# Patient Record
Sex: Male | Born: 1955 | Race: White | Hispanic: No | Marital: Married | Smoking: Former smoker
Health system: Southern US, Community
[De-identification: ages and names within clinical notes are randomized; demographics above are authoritative.]

## PROBLEM LIST (undated history)

## (undated) DIAGNOSIS — N200 Calculus of kidney: Secondary | ICD-10-CM

## (undated) DIAGNOSIS — G4730 Sleep apnea, unspecified: Secondary | ICD-10-CM

## (undated) DIAGNOSIS — Z87442 Personal history of urinary calculi: Secondary | ICD-10-CM

## (undated) HISTORY — PX: CYST REMOVAL TRUNK: SHX6283

## (undated) HISTORY — PX: NASAL SINUS SURGERY: SHX719

## (undated) HISTORY — DX: Calculus of kidney: N20.0

## (undated) HISTORY — PX: VASECTOMY: SHX75

---

## 2000-02-12 ENCOUNTER — Encounter: Admission: RE | Admit: 2000-02-12 | Discharge: 2000-05-12 | Payer: Self-pay | Admitting: Family Medicine

## 2000-02-24 ENCOUNTER — Emergency Department (HOSPITAL_COMMUNITY): Admission: EM | Admit: 2000-02-24 | Discharge: 2000-02-24 | Payer: Self-pay | Admitting: Emergency Medicine

## 2000-03-17 ENCOUNTER — Ambulatory Visit: Admission: RE | Admit: 2000-03-17 | Discharge: 2000-03-17 | Payer: Self-pay | Admitting: Family Medicine

## 2000-12-18 ENCOUNTER — Ambulatory Visit (HOSPITAL_BASED_OUTPATIENT_CLINIC_OR_DEPARTMENT_OTHER): Admission: RE | Admit: 2000-12-18 | Discharge: 2000-12-18 | Payer: Self-pay | Admitting: Pulmonary Disease

## 2001-01-10 ENCOUNTER — Emergency Department (HOSPITAL_COMMUNITY): Admission: EM | Admit: 2001-01-10 | Discharge: 2001-01-11 | Payer: Self-pay

## 2003-01-25 ENCOUNTER — Encounter: Payer: Self-pay | Admitting: Family Medicine

## 2003-01-25 ENCOUNTER — Encounter: Admission: RE | Admit: 2003-01-25 | Discharge: 2003-01-25 | Payer: Self-pay | Admitting: Family Medicine

## 2003-09-30 ENCOUNTER — Encounter: Payer: Self-pay | Admitting: Emergency Medicine

## 2003-09-30 ENCOUNTER — Emergency Department (HOSPITAL_COMMUNITY): Admission: EM | Admit: 2003-09-30 | Discharge: 2003-09-30 | Payer: Self-pay | Admitting: Emergency Medicine

## 2004-05-11 ENCOUNTER — Ambulatory Visit (HOSPITAL_COMMUNITY): Admission: RE | Admit: 2004-05-11 | Discharge: 2004-05-11 | Payer: Self-pay | Admitting: Gastroenterology

## 2005-04-09 ENCOUNTER — Encounter (INDEPENDENT_AMBULATORY_CARE_PROVIDER_SITE_OTHER): Payer: Self-pay | Admitting: Specialist

## 2005-04-09 ENCOUNTER — Ambulatory Visit (HOSPITAL_COMMUNITY): Admission: RE | Admit: 2005-04-09 | Discharge: 2005-04-09 | Payer: Self-pay | Admitting: General Surgery

## 2008-07-08 ENCOUNTER — Ambulatory Visit (HOSPITAL_COMMUNITY): Admission: RE | Admit: 2008-07-08 | Discharge: 2008-07-08 | Payer: Self-pay | Admitting: Urology

## 2011-02-22 ENCOUNTER — Other Ambulatory Visit: Payer: Self-pay | Admitting: Family Medicine

## 2011-02-22 ENCOUNTER — Ambulatory Visit
Admission: RE | Admit: 2011-02-22 | Discharge: 2011-02-22 | Disposition: A | Discharge status: Home / Self Care | Payer: 59 | Source: Ambulatory Visit | Attending: Family Medicine | Admitting: Family Medicine

## 2011-02-22 DIAGNOSIS — R413 Other amnesia: Secondary | ICD-10-CM

## 2011-02-22 DIAGNOSIS — R51 Headache: Secondary | ICD-10-CM

## 2011-05-07 NOTE — Op Note (Signed)
NAME:  Chad Mcfarland, Chad Mcfarland                         ACCOUNT NO.:  [id number]   MEDICAL RECORD NO.:  [id number]                   PATIENT TYPE:  AMB   LOCATION:  ENDO                                 FACILITY:  [facility]   PHYSICIAN:  John C. Madilyn Fireman, M.D.                 DATE OF BIRTH:  05/14/1956   DATE OF PROCEDURE:  05/11/2004  DATE OF DISCHARGE:                                 OPERATIVE REPORT   PROCEDURE:  Colonoscopy.   INDICATIONS FOR PROCEDURE:  Family history of colon polyps in several first-  degree relatives and intermittent rectal bleeding.   DESCRIPTION OF PROCEDURE:  The patient was placed in the left lateral  decubitus position and placed on the pulse monitor with continuous low-flow  oxygen delivered by nasal cannula.  He was sedated with 75 mcg IV fentanyl  and 7 mg IV Versed.  The Olympus video colonoscope was inserted into the  rectum and advanced to the cecum, confirmed by transillumination at  McBurney's point and visualization at the ileocecal valve and appendiceal  orifice.  The prep was excellent.  The cecum, ascending, transverse,  descending, and sigmoid colon all appeared normal with no masses, polyps,  diverticula, or other mucosal abnormalities.  The rectum likewise appeared  normal, and retroflexed view of the anus revealed only small internal  hemorrhoids.  The scope was then withdrawn, and the patient returned to the  recovery room in stable condition.  He tolerated the procedure well, and  there were no immediate complications.   IMPRESSION:  1. Internal hemorrhoids.  2. Otherwise, normal colonoscopy.   PLAN:  We will repeat colonoscopy at age 55.                                               John C. Madilyn Fireman, M.D.    JCH/MEDQ  D:  05/11/2004  T:  05/11/2004  Job:  [id number]   cc:   Georgianne Fick, M.D.  [address]  [place]  [place] [address]  Fax: [phone]

## 2011-05-07 NOTE — Op Note (Signed)
NAMEDEQUAVIOUS, HARSHBERGER               ACCOUNT NO.:  [id number]   MEDICAL RECORD NO.:  [id number]          PATIENT TYPE:  AMB   LOCATION:  DAY                          FACILITY:  [facility]   PHYSICIAN:  Angelia Mould. Derrell Lolling, M.D.DATE OF BIRTH:  03-28-1956   DATE OF PROCEDURE:  04/09/2005  DATE OF DISCHARGE:                                 OPERATIVE REPORT   PREOPERATIVE DIAGNOSIS:  Multiple cysts of the back, chest wall, left  shoulder, and neck.   POSTOPERATIVE DIAGNOSIS:  Multiple cysts of the back, chest wall, left  shoulder, and neck.   PROCEDURE:  1.  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  2.  Excision of 3-mm cyst of upper back.  3.  Excision of 2.0-cm cyst of lumbar back.  4.  Excision of 1.0-cm cyst of right lateral chest wall.  5.  Excision of 1.0-cm cyst of left shoulder.   SURGEON:  Angelia Mould. Derrell Lolling, M.D.   INDICATIONS FOR PROCEDURE:  This is a 55 year old white male who has  multiple cysts and skin nodules that he wants excised.  The one in the  lumbar back, the one in the upper back, and the one in the right lateral  chest wall have all become inflamed and have drained sebaceous material.  We  sometimes have to manipulate these to get them to evacuate.  He also has a  fibrous nodule in the right posterior neck that he wants to have removed.  He has been examined in the office and counseled regarding surgery, and he  wants to have all of these removed.   DESCRIPTION OF PROCEDURE:  The patient was brought to the operating room and  placed supine.  He then had general endotracheal anesthesia induced.  He was  then rolled over and placed in a prone position with the right arm up and  the left arm down by his side.  I found that I was able to prep the neck  incision, the left shoulder incision, and all the back and chest wall  incisions into one field so that we could excise these in one position.  Marcaine 0.5% with epinephrine was used as a local infiltration  anesthetic.   On the left shoulder lesion and the lumbar back lesion, I made a sagittally-  oriented elliptical incision and excised what appeared to be chronic benign  cystic material.  On the upper back, neck, and right lateral chest wall, I  performed transverse elliptical incisions, excising the cysts and/or  nodules.  Appropriate length incisions were made to get all the way around  these according to their size.  All of these were labeled separately and  sent to pathology separately.  The wounds were irrigated with saline.  Hemostasis was excellent and achieved with electrocautery.  The lumbar  incision was relatively large, and we closed the subcutaneous tissue with  interrupted sutures of 3-0 Vicryl.  After that, all of the incisions were  closed with skin staples.  Clean bandages were placed.   The patient was taken to the recovery room in stable condition.  Estimated  blood  loss was about 20 cc or less.  There were no complications.  Sponge,  needle, and instrument counts were correct.      HMI/MEDQ  D:  04/09/2005  T:  04/09/2005  Job:  [id number]   cc:   Leanne Chang, M.D.  [address]  [place]  [place] [address]  Fax: [phone]

## 2012-07-05 ENCOUNTER — Ambulatory Visit (INDEPENDENT_AMBULATORY_CARE_PROVIDER_SITE_OTHER): Payer: 59 | Admitting: Physician Assistant

## 2012-07-05 VITALS — BP 110/62 | HR 81 | Temp 97.6°F | Resp 18 | Ht 66.75 in | Wt 218.0 lb

## 2012-07-05 DIAGNOSIS — H6520 Chronic serous otitis media, unspecified ear: Secondary | ICD-10-CM

## 2012-07-05 DIAGNOSIS — J029 Acute pharyngitis, unspecified: Secondary | ICD-10-CM

## 2012-07-05 LAB — POCT RAPID STREP A (OFFICE): Rapid Strep A Screen: NEGATIVE

## 2012-07-05 MED ORDER — AZITHROMYCIN 250 MG PO TABS: ORAL_TABLET | ORAL | Status: AC

## 2012-07-05 MED ORDER — ANTIPYRINE-BENZOCAINE 5.4-1.4 % OT SOLN: 3.0000 [drp] | Freq: Four times a day (QID) | OTIC | Status: AC | PRN

## 2012-07-05 MED ORDER — IPRATROPIUM BROMIDE 0.06 % NA SOLN: 2.0000 | Freq: Three times a day (TID) | NASAL | Status: DC

## 2012-07-05 NOTE — Progress Notes (Signed)
Patient ID: Chad Mcfarland MRN: [id number], DOB: 07-03-1956, 56 y.o. Date of Encounter: 07/05/2012, 10:06 AM  Primary Physician: Elvina Sidle, MD  Chief Complaint:  Chief Complaint  Patient presents with  . Sore Throat    x 2 days  . Otalgia    x 2 days    HPI: 56 y.o. year old male presents with a two day history of sore throat and otalgia. Afebrile. No chills. No nasal congestion or rhinorrhea. No cough. Ears feel full, leading to sensation of muffled hearing. Has tried OTC cold preps without success. No GI complaints. Appetite normal.  No sick contacts, recent antibiotics, or recent travels.   No leg trauma, sedentary periods, h/o cancer, or tobacco use.  No past medical history on file.   Home Meds: Prior to Admission medications   Medication Sig Start Date End Date Taking? Authorizing Provider  aspirin 81 MG tablet Take 81 mg by mouth daily.   Yes Historical Provider, MD  Coenzyme Q10 (CO Q 10) 100 MG CAPS Take by mouth.   Yes Historical Provider, MD  fish oil-omega-3 fatty acids 1000 MG capsule Take 2 g by mouth daily.   Yes Historical Provider, MD  Multiple Vitamin (MULTIVITAMIN) tablet Take 1 tablet by mouth daily.   Yes Historical Provider, MD  Red Yeast Rice 600 MG CAPS Take by mouth.   Yes Historical Provider, MD   Allergies:  Allergies  Allergen Reactions  . Codeine   . Tetracyclines & Related     History   Social History  . Marital Status: Married    Spouse Name: N/A    Number of Children: N/A  . Years of Education: N/A   Occupational History  . Not on file.   Social History Main Topics  . Smoking status: Former Smoker    Types: Cigarettes    Quit date: 02/25/1997  . Smokeless tobacco: Not on file  . Alcohol Use: Not on file  . Drug Use: Not on file  . Sexually Active: Not on file   Other Topics Concern  . Not on file   Social History Narrative  . No narrative on file     Review of Systems: Constitutional: negative for chills,  fever, night sweats or weight changes Cardiovascular: negative for chest pain or palpitations Respiratory: negative for hemoptysis, wheezing, or shortness of breath Abdominal: negative for abdominal pain, nausea, vomiting or diarrhea Dermatological: negative for rash Neurologic: negative for headache   Physical Exam: Blood pressure 110/62, pulse 81, temperature 97.6 F (36.4 C), temperature source Oral, resp. rate 18, height 5' 6.75" (1.695 m), weight 218 lb (98.884 kg), SpO2 95.00%., Body mass index is 34.40 kg/(m^2). General: Well developed, well nourished, in no acute distress. Head: Normocephalic, atraumatic, eyes without discharge, sclera non-icteric, nares are congested. Bilateral auditory canals clear, TM's are without perforation, pearly grey with reflective cone of light bilaterally. Serous effusion bilaterally behind bilateral TM's. Maxillary sinus TTP. Oral cavity moist, dentition normal. Posterior pharynx with post nasal drip and mild erythema. No peritonsillar abscess or tonsillar exudate. Neck: Supple. No thyromegaly. Full ROM. No lymphadenopathy. Lungs: Clear bilaterally to auscultation without wheezes, rales, or rhonchi. Breathing is unlabored.  Heart: RRR with S1 S2. No murmurs, rubs, or gallops appreciated. Abdomen: Soft, non-tender, non-distended with normoactive bowel sounds. No hepatosplenomegaly. No rebound/guarding. No obvious abdominal masses. McBurney's, Rovsing's, Iliopsoas, and table jar all negative. Msk:  Strength and tone normal for age. Extremities: No clubbing or cyanosis. No edema. Neuro: Alert and oriented  X 3. Moves all extremities spontaneously. CNII-XII grossly in tact. Psych:  Responds to questions appropriately with a normal affect.   Labs: Results for orders placed in visit on 07/05/12  POCT RAPID STREP A (OFFICE)      Component Value Range   Rapid Strep A Screen Negative  Negative     ASSESSMENT AND PLAN:  56 y.o. year old male with pharyngitis  and serous OM. -Azithromycin 250 MG #6 2 po first day then 1 po next 4 days no RF -Atrovent NS 0.06% 2 sprays each nare bid prn #1 no RF -A/B Otic #1 3gtt qid prn no RF -Mucinex -Tylenol/Motrin prn -Rest/fluids -RTC precautions -RTC 3-5 days if no improvement  Signed, Eula Listen, PA-C 07/05/2012 10:06 AM

## 2012-11-09 ENCOUNTER — Ambulatory Visit (INDEPENDENT_AMBULATORY_CARE_PROVIDER_SITE_OTHER): Payer: 59 | Admitting: Family Medicine

## 2012-11-09 ENCOUNTER — Encounter: Payer: Self-pay | Admitting: Family Medicine

## 2012-11-09 VITALS — BP 130/92 | HR 77 | Temp 98.3°F | Resp 16 | Ht 65.5 in | Wt 216.2 lb

## 2012-11-09 DIAGNOSIS — R7309 Other abnormal glucose: Secondary | ICD-10-CM

## 2012-11-09 DIAGNOSIS — N200 Calculus of kidney: Secondary | ICD-10-CM

## 2012-11-09 DIAGNOSIS — Z0000 Encounter for general adult medical examination without abnormal findings: Secondary | ICD-10-CM

## 2012-11-09 DIAGNOSIS — M549 Dorsalgia, unspecified: Secondary | ICD-10-CM

## 2012-11-09 LAB — POCT URINALYSIS DIPSTICK
Bilirubin, UA: NEGATIVE
Blood, UA: NEGATIVE
Glucose, UA: NEGATIVE
Ketones, UA: NEGATIVE
Leukocytes, UA: NEGATIVE
Nitrite, UA: NEGATIVE
Protein, UA: NEGATIVE
Spec Grav, UA: 1.02
Urobilinogen, UA: 0.2
pH, UA: 7

## 2012-11-09 LAB — CBC
HCT: 44.3 % (ref 39.0–52.0)
Hemoglobin: 15.9 g/dL (ref 13.0–17.0)
MCH: 32.6 pg (ref 26.0–34.0)
MCHC: 35.9 g/dL (ref 30.0–36.0)
MCV: 91 fL (ref 78.0–100.0)
Platelets: 143 10*3/uL — ABNORMAL LOW (ref 150–400)
RBC: 4.87 MIL/uL (ref 4.22–5.81)
RDW: 13.6 % (ref 11.5–15.5)
WBC: 5.6 10*3/uL (ref 4.0–10.5)

## 2012-11-09 LAB — COMPREHENSIVE METABOLIC PANEL
ALT: 37 U/L (ref 0–53)
AST: 30 U/L (ref 0–37)
Albumin: 4.4 g/dL (ref 3.5–5.2)
Alkaline Phosphatase: 74 U/L (ref 39–117)
BUN: 14 mg/dL (ref 6–23)
CO2: 28 mEq/L (ref 19–32)
Calcium: 9.3 mg/dL (ref 8.4–10.5)
Chloride: 101 mEq/L (ref 96–112)
Creat: 0.84 mg/dL (ref 0.50–1.35)
Glucose, Bld: 96 mg/dL (ref 70–99)
Potassium: 4.5 mEq/L (ref 3.5–5.3)
Sodium: 138 mEq/L (ref 135–145)
Total Bilirubin: 0.6 mg/dL (ref 0.3–1.2)
Total Protein: 7.2 g/dL (ref 6.0–8.3)

## 2012-11-09 LAB — LIPID PANEL
Cholesterol: 183 mg/dL (ref 0–200)
HDL: 32 mg/dL — ABNORMAL LOW (ref >39)
Total CHOL/HDL Ratio: 5.7 Ratio
Triglycerides: 512 mg/dL — ABNORMAL HIGH (ref <150)

## 2012-11-09 LAB — IFOBT (OCCULT BLOOD): IFOBT: NEGATIVE

## 2012-11-09 LAB — POCT GLYCOSYLATED HEMOGLOBIN (HGB A1C): Hemoglobin A1C: 5.4

## 2012-11-09 LAB — SEDIMENTATION RATE: Sed Rate: 1 mm/hr (ref 0–16)

## 2012-11-09 LAB — PSA: PSA: 0.81 ng/mL (ref <=4.00)

## 2012-11-09 MED ORDER — PREDNISONE 20 MG PO TABS: 40.0000 mg | ORAL_TABLET | Freq: Every day | ORAL | Status: DC

## 2012-11-09 NOTE — Patient Instructions (Addendum)
Health Maintenance, Males A healthy lifestyle and preventative care can promote health and wellness.  Maintain regular health, dental, and eye exams.  Eat a healthy diet. Foods like vegetables, fruits, whole grains, low-fat dairy products, and lean protein foods contain the nutrients you need without too many calories. Decrease your intake of foods high in solid fats, added sugars, and salt. Get information about a proper diet from your caregiver, if necessary.  Regular physical exercise is one of the most important things you can do for your health. Most adults should get at least 150 minutes of moderate-intensity exercise (any activity that increases your heart rate and causes you to sweat) each week. In addition, most adults need muscle-strengthening exercises on 2 or more days a week.   Maintain a healthy weight. The body mass index (BMI) is a screening tool to identify possible weight problems. It provides an estimate of body fat based on height and weight. Your caregiver can help determine your BMI, and can help you achieve or maintain a healthy weight. For adults 20 years and older:  A BMI below 18.5 is considered underweight.  A BMI of 18.5 to 24.9 is normal.  A BMI of 25 to 29.9 is considered overweight.  A BMI of 30 and above is considered obese.  Maintain normal blood lipids and cholesterol by exercising and minimizing your intake of saturated fat. Eat a balanced diet with plenty of fruits and vegetables. Blood tests for lipids and cholesterol should begin at age 20 and be repeated every 5 years. If your lipid or cholesterol levels are high, you are over 50, or you are a high risk for heart disease, you may need your cholesterol levels checked more frequently.Ongoing high lipid and cholesterol levels should be treated with medicines, if diet and exercise are not effective.  If you smoke, find out from your caregiver how to quit. If you do not use tobacco, do not start.  If you  choose to drink alcohol, do not exceed 2 drinks per day. One drink is considered to be 12 ounces (355 mL) of beer, 5 ounces (148 mL) of wine, or 1.5 ounces (44 mL) of liquor.  Avoid use of street drugs. Do not share needles with anyone. Ask for help if you need support or instructions about stopping the use of drugs.  High blood pressure causes heart disease and increases the risk of stroke. Blood pressure should be checked at least every 1 to 2 years. Ongoing high blood pressure should be treated with medicines if weight loss and exercise are not effective.  If you are 45 to 56 years old, ask your caregiver if you should take aspirin to prevent heart disease.  Diabetes screening involves taking a blood sample to check your fasting blood sugar level. This should be done once every 3 years, after age 45, if you are within normal weight and without risk factors for diabetes. Testing should be considered at a younger age or be carried out more frequently if you are overweight and have at least 1 risk factor for diabetes.  Colorectal cancer can be detected and often prevented. Most routine colorectal cancer screening begins at the age of 50 and continues through age 75. However, your caregiver may recommend screening at an earlier age if you have risk factors for colon cancer. On a yearly basis, your caregiver may provide home test kits to check for hidden blood in the stool. Use of a small camera at the end of a tube,   to directly examine the colon (sigmoidoscopy or colonoscopy), can detect the earliest forms of colorectal cancer. Talk to your caregiver about this at age 50, when routine screening begins. Direct examination of the colon should be repeated every 5 to 10 years through age 75, unless early forms of pre-cancerous polyps or small growths are found.  Hepatitis C blood testing is recommended for all people born from 1945 through 1965 and any individual with known risks for hepatitis C.  Healthy  men should no longer receive prostate-specific antigen (PSA) blood tests as part of routine cancer screening. Consult with your caregiver about prostate cancer screening.  Testicular cancer screening is not recommended for adolescents or adult males who have no symptoms. Screening includes self-exam, caregiver exam, and other screening tests. Consult with your caregiver about any symptoms you have or any concerns you have about testicular cancer.  Practice safe sex. Use condoms and avoid high-risk sexual practices to reduce the spread of sexually transmitted infections (STIs).  Use sunscreen with a sun protection factor (SPF) of 30 or greater. Apply sunscreen liberally and repeatedly throughout the day. You should seek shade when your shadow is shorter than you. Protect yourself by wearing long sleeves, pants, a wide-brimmed hat, and sunglasses year round, whenever you are outdoors.  Notify your caregiver of new moles or changes in moles, especially if there is a change in shape or color. Also notify your caregiver if a mole is larger than the size of a pencil eraser.  A one-time screening for abdominal aortic aneurysm (AAA) and surgical repair of large AAAs by sound wave imaging (ultrasonography) is recommended for ages 65 to 75 years who are current or former smokers.  Stay current with your immunizations. Document Released: 06/03/2008 Document Revised: 02/28/2012 Document Reviewed: 05/03/2011 [employer] Patient Information 2013 [employer].  

## 2012-11-09 NOTE — Progress Notes (Signed)
@[email or web address] @  Patient ID: Chad Mcfarland MRN: [id number], DOB: Oct 06, 1956 56 y.o. Date of Encounter: 11/09/2012, 8:11 AM  Primary Physician: Elvina Sidle, MD  Chief Complaint: Physical (CPE)  HPI: 56 y.o. y/o male with history noted below here for CPE.  Chad Mcfarland fell last May when stepping off slippery lawn mower.  He hit his head and may have had brief LOC.  He went to [facility] for evaluation including a head CT which was normal.  He suffered dysequilibrium for two to three months afterwards, precipitated by sudden moving.  He decided to check his blood sugars when the dizziness occurred, and the levels have ranged from 90-120, not high enough to account for the dizziness.  At this point, the symptoms seem to have cleared  Chad Mcfarland suffers from chronic LBP x 15 years.  We have used prednisone with good effect in the past, but the relief is only temporary.  He is postponing surgery as long as possible.  He also has some stiffness in the right hip.  He has a h/o kidney stones, but, despite the presence of a small stone on imaging, he has no symptoms at present.  He needs a refill on his epi pen and would like to get the shingles vaccine.  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  Patient had colonoscopy 5 years ago and had no significant findings.  He had a pneumovax already.  Review of Systems: Consitutional: No fever, chills, fatigue, night sweats, lymphadenopathy, or weight changes. Eyes: No visual changes, eye redness, or discharge. ENT/Mouth: Ears: No otalgia, tinnitus, hearing loss, discharge. Nose: No congestion, rhinorrhea, sinus pain, or epistaxis. Throat: No sore throat, post nasal drip, or teeth pain. Cardiovascular: No CP, palpitations, diaphoresis, DOE, edema, orthopnea, PND. Respiratory: No cough, hemoptysis, SOB, or wheezing. Gastrointestinal: No anorexia, dysphagia, reflux, pain, nausea, vomiting, hematemesis, diarrhea, constipation, BRBPR, or melena. Genitourinary: No dysuria,  frequency, urgency, hematuria, incontinence, nocturia, decreased urinary stream, discharge, impotence, or testicular pain/masses. Musculoskeletal: No decreased ROM, myalgias, stiffness, joint swelling, or weakness.  Chronic low back pain for 15 years, relieved by prednisone temporarily Skin: No rash, erythema, lesion changes, pain, warmth, jaundice, or pruritis. Neurological: No headache, dizziness, syncope, seizures, tremors, memory loss, coordination problems, or paresthesias. Psychological: No anxiety, depression, hallucinations, SI/HI. Endocrine: No fatigue, polydipsia, polyphagia, polyuria, or known diabetes. All other systems were reviewed and are otherwise negative.  Past Medical History  Diagnosis Date  . Kidney stone      Past Surgical History  Procedure Date  . Vasectomy   . Nasal sinus surgery     Home Meds:  Prior to Admission medications   Medication Sig Start Date End Date Taking? Authorizing Provider  aspirin 81 MG tablet Take 81 mg by mouth daily.   Yes Historical Provider, MD  Coenzyme Q10 (CO Q 10) 100 MG CAPS Take by mouth.   Yes Historical Provider, MD  fish oil-omega-3 fatty acids 1000 MG capsule Take 2 g by mouth daily.   Yes Historical Provider, MD  Multiple Vitamin (MULTIVITAMIN) tablet Take 1 tablet by mouth daily.   Yes Historical Provider, MD  niacin 500 MG tablet Take 500 mg by mouth daily with breakfast.   Yes Historical Provider, MD  OVER THE COUNTER MEDICATION Glucosamine 150 mg one daily   Yes Historical Provider, MD  Red Yeast Rice 600 MG CAPS Take by mouth.   Yes Historical Provider, MD  ipratropium (ATROVENT) 0.06 % nasal spray Place 2 sprays into the nose 3 (three) times daily.  07/05/12 07/05/13  Sondra Barges, PA-C    Allergies:  Allergies  Allergen Reactions  . Codeine   . Tetracyclines & Related     History   Social History  . Marital Status: Married    Spouse Name: N/A    Number of Children: N/A  . Years of Education: N/A    Occupational History  . Not on file.   Social History Main Topics  . Smoking status: Former Smoker    Types: Cigarettes    Quit date: 02/25/1997  . Smokeless tobacco: Not on file  . Alcohol Use: Yes  . Drug Use: No  . Sexually Active: Not on file   Other Topics Concern  . Not on file   Social History Narrative  . No narrative on file    Family History  Problem Relation Age of Onset  . Heart disease Mother   . Diabetes Mother   . Diabetes Sister     Physical Exam: Blood pressure 130/92, pulse 77, temperature 98.3 F (36.8 C), temperature source Oral, resp. rate 16, height 5' 5.5" (1.664 m), weight 216 lb 3.2 oz (98.068 kg), SpO2 96.00%.  General: Well developed, well nourished, in no acute distress. HEENT: Normocephalic, atraumatic. Conjunctiva pink, sclera non-icteric. Pupils 2 mm constricting to 1 mm, round, regular, and equally reactive to light and accomodation. EOMI. Internal auditory canal clear. TMs with good cone of light and without pathology. Nasal mucosa pink. Nares are without discharge. No sinus tenderness. Oral mucosa pink. Dentition normal. Pharynx without exudate. Small ranula left buccal mucosa  Neck: Supple. Trachea midline. No thyromegaly. Full ROM. No lymphadenopathy. Lungs: Clear to auscultation bilaterally without wheezes, rales, or rhonchi. Breathing is of normal effort and unlabored. Cardiovascular: RRR with S1 S2. No murmurs, rubs, or gallops appreciated. Distal pulses 2+ symmetrically. No carotid or abdominal bruits Abdomen: Soft, non-tender, non-distended with normoactive bowel sounds. No hepatosplenomegaly or masses. No rebound/guarding. No CVA tenderness. Umbilical hernia present Rectal: No external hemorrhoids or fissures. Rectal vault without masses.  Genitourinary:  circumcised male. No penile lesions. Testes descended bilaterally, and smooth without tenderness or masses.  Musculoskeletal: Full range of motion and 5/5 strength throughout.  Without swelling, atrophy, tenderness, crepitus, or warmth. Extremities without clubbing, cyanosis, or edema. Calves supple. Skin: Warm and moist without erythema, ecchymosis, wounds, or rash. Sebaceous cyst 1/2 cm left scapular area.  Scattered freckles on backs. Surgical scar left anterior shoulder from mole removal as teenager Neuro: A+Ox3. CN II-XII grossly intact. Moves all extremities spontaneously. Full sensation throughout. Normal gait. DTR 2+ throughout upper and lower extremities. Finger to nose intact. Psych:  Responds to questions appropriately with a normal affect.   Studies: CBC, CMET, Lipid, PSA, TSH, sed rate, A1C UA:   Assessment/Plan:  56 y.o. y/o  male here for CPE Aside from the back pain, he is doing fairly well.  I would like him to lose 50 lbs as this would help with the back pain and hip soreness. 1. Annual physical exam  TSH, PSA, Lipid panel, Comprehensive metabolic panel, CBC, Sedimentation Rate, IFOBT POC (occult bld, rslt in office)  2. Back pain  PSA, CBC, Sedimentation Rate, predniSONE (DELTASONE) 20 MG tablet, POCT urinalysis dipstick  3. Kidney stone  POCT urinalysis dipstick  4. Elevated glucose  POCT glycosylated hemoglobin (Hb A1C)   I wrote for refills on the epipen, and the Zostavax (shingles vaccine) separately -  Signed, Elvina Sidle, MD 11/09/2012 8:11 AM

## 2012-11-10 LAB — TSH: TSH: 3.029 u[IU]/mL (ref 0.350–4.500)

## 2013-09-03 ENCOUNTER — Other Ambulatory Visit: Payer: Self-pay | Admitting: Dermatology

## 2013-10-10 ENCOUNTER — Encounter: Payer: Self-pay | Admitting: *Deleted

## 2013-11-03 ENCOUNTER — Encounter: Payer: Self-pay | Admitting: Family Medicine

## 2013-11-03 ENCOUNTER — Ambulatory Visit: Payer: Self-pay | Admitting: Family Medicine

## 2013-11-03 ENCOUNTER — Ambulatory Visit (INDEPENDENT_AMBULATORY_CARE_PROVIDER_SITE_OTHER): Payer: 59 | Admitting: Family Medicine

## 2013-11-03 VITALS — BP 126/80 | HR 88 | Temp 98.0°F | Resp 16 | Ht 65.5 in | Wt 222.0 lb

## 2013-11-03 DIAGNOSIS — Z0000 Encounter for general adult medical examination without abnormal findings: Secondary | ICD-10-CM

## 2013-11-03 DIAGNOSIS — Z0289 Encounter for other administrative examinations: Secondary | ICD-10-CM

## 2013-11-03 LAB — COMPREHENSIVE METABOLIC PANEL
ALT: 41 U/L (ref 0–53)
AST: 30 U/L (ref 0–37)
Albumin: 4.4 g/dL (ref 3.5–5.2)
Alkaline Phosphatase: 72 U/L (ref 39–117)
BUN: 16 mg/dL (ref 6–23)
CO2: 25 mEq/L (ref 19–32)
Calcium: 8.9 mg/dL (ref 8.4–10.5)
Chloride: 108 mEq/L (ref 96–112)
Creat: 0.87 mg/dL (ref 0.50–1.35)
Glucose, Bld: 111 mg/dL — ABNORMAL HIGH (ref 70–99)
Potassium: 4.2 mEq/L (ref 3.5–5.3)
Sodium: 135 mEq/L (ref 135–145)
Total Bilirubin: 0.6 mg/dL (ref 0.3–1.2)
Total Protein: 7.1 g/dL (ref 6.0–8.3)

## 2013-11-03 LAB — POCT CBC
Granulocyte percent: 54.1 %G (ref 37–80)
HCT, POC: 48.3 % (ref 43.5–53.7)
Hemoglobin: 15.6 g/dL (ref 14.1–18.1)
Lymph, poc: 2.1 (ref 0.6–3.4)
MCH, POC: 32.2 pg — AB (ref 27–31.2)
MCHC: 32.3 g/dL (ref 31.8–35.4)
MCV: 99.6 fL — AB (ref 80–97)
MID (cbc): 0.4 (ref 0–0.9)
MPV: 8 fL (ref 0–99.8)
POC Granulocyte: 2.9 (ref 2–6.9)
POC LYMPH PERCENT: 38.9 %L (ref 10–50)
POC MID %: 7 %M (ref 0–12)
Platelet Count, POC: 136 10*3/uL — AB (ref 142–424)
RBC: 4.85 M/uL (ref 4.69–6.13)
RDW, POC: 14.4 %
WBC: 5.4 10*3/uL (ref 4.6–10.2)

## 2013-11-03 LAB — POCT URINALYSIS DIPSTICK
Bilirubin, UA: NEGATIVE
Blood, UA: NEGATIVE
Glucose, UA: NEGATIVE
Ketones, UA: NEGATIVE
Leukocytes, UA: NEGATIVE
Nitrite, UA: NEGATIVE
Protein, UA: NEGATIVE
Spec Grav, UA: 1.02
Urobilinogen, UA: 0.2
pH, UA: 5

## 2013-11-03 LAB — LIPID PANEL
Cholesterol: 206 mg/dL — ABNORMAL HIGH (ref 0–200)
HDL: 38 mg/dL — ABNORMAL LOW (ref >39)
LDL Cholesterol: 101 mg/dL — ABNORMAL HIGH (ref 0–99)
Total CHOL/HDL Ratio: 5.4 Ratio
Triglycerides: 336 mg/dL — ABNORMAL HIGH (ref <150)
VLDL: 67 mg/dL — ABNORMAL HIGH (ref 0–40)

## 2013-11-03 LAB — IFOBT (OCCULT BLOOD): IFOBT: NEGATIVE

## 2013-11-03 NOTE — Progress Notes (Signed)
Patient ID: Chad Mcfarland MRN: [id number], DOB: 09-01-1956 57 y.o. Date of Encounter: 11/03/2013, 8:59 AM  Primary Physician: Elvina Sidle, MD  Chief Complaint: Physical (CPE)  HPI: 57 y.o. y/o male with history noted below here for CPE.  Doing well. No issues/complaints. Married.  Not driving but wants to keep [employer] Works for [employer].  Review of Systems: Consitutional: No fever, chills, fatigue, night sweats, lymphadenopathy, or weight changes. Eyes: No visual changes, eye redness, or discharge. ENT/Mouth: Ears: No otalgia, tinnitus, hearing loss, discharge. Nose: No congestion, rhinorrhea, sinus pain, or epistaxis. Throat: No sore throat, post nasal drip, or teeth pain. Cardiovascular: No CP, palpitations, diaphoresis, DOE, edema, orthopnea, PND. Respiratory: No cough, hemoptysis, SOB, or wheezing. Gastrointestinal: No anorexia, dysphagia, reflux, pain, nausea, vomiting, hematemesis, diarrhea, constipation, BRBPR, or melena. Genitourinary: No dysuria, frequency, urgency, hematuria, incontinence, nocturia, decreased urinary stream, discharge, impotence, or testicular pain/masses. Musculoskeletal: No decreased ROM, myalgias, stiffness, joint swelling, or weakness. Skin: No rash, erythema, lesion changes, pain, warmth, jaundice, or pruritis. Neurological: No headache, dizziness, syncope, seizures, tremors, memory loss, coordination problems, or paresthesias. Psychological: No anxiety, depression, hallucinations, SI/HI. Endocrine: No fatigue, polydipsia, polyphagia, polyuria, or known diabetes. All other systems were reviewed and are otherwise negative.  Past Medical History  Diagnosis Date  . Kidney stone      Past Surgical History  Procedure Laterality Date  . Vasectomy    . Nasal sinus surgery      Home Meds:  Prior to Admission medications   Medication Sig Start Date End Date Taking? Authorizing Provider  aspirin 81 MG tablet Take 81 mg by mouth daily.   Yes  Historical Provider, MD  Coenzyme Q10 (CO Q 10) 100 MG CAPS Take by mouth.   Yes Historical Provider, MD  fish oil-omega-3 fatty acids 1000 MG capsule Take 2 g by mouth daily.   Yes Historical Provider, MD  Multiple Vitamin (MULTIVITAMIN) tablet Take 1 tablet by mouth daily.   Yes Historical Provider, MD  niacin 500 MG tablet Take 500 mg by mouth daily with breakfast.   Yes Historical Provider, MD  Red Yeast Rice 600 MG CAPS Take by mouth.   Yes Historical Provider, MD  ipratropium (ATROVENT) 0.06 % nasal spray Place 2 sprays into the nose 3 (three) times daily. 07/05/12 07/05/13  Ryan M Dunn, PA-C  OVER THE COUNTER MEDICATION Glucosamine 150 mg one daily    Historical Provider, MD  predniSONE (DELTASONE) 20 MG tablet Take 2 tablets (40 mg total) by mouth daily. 11/09/12   Elvina Sidle, MD    Allergies:  Allergies  Allergen Reactions  . Codeine   . Tetracyclines & Related     History   Social History  . Marital Status: Married    Spouse Name: N/A    Number of Children: N/A  . Years of Education: N/A   Occupational History  . Not on file.   Social History Main Topics  . Smoking status: Former Smoker    Types: Cigarettes    Quit date: 02/25/1997  . Smokeless tobacco: Not on file  . Alcohol Use: Yes  . Drug Use: No  . Sexual Activity: Not on file   Other Topics Concern  . Not on file   Social History Narrative  . No narrative on file    Family History  Problem Relation Age of Onset  . Heart disease Mother   . Diabetes Mother   . Diabetes Sister     Physical Exam: Blood pressure 126/80,  pulse 88, temperature 98 F (36.7 C), temperature source Oral, resp. rate 16, height 5' 5.5" (1.664 m), weight 222 lb (100.699 kg), SpO2 96.00%.  General: Well developed, well nourished, in no acute distress. HEENT: Normocephalic, atraumatic. Conjunctiva pink, sclera non-icteric. Pupils 2 mm constricting to 1 mm, round, regular, and equally reactive to light and accomodation. EOMI.  Internal auditory canal clear. TMs with good cone of light and without pathology. Nasal mucosa pink. Nares are without discharge. No sinus tenderness. Oral mucosa pink. Dentition normal. Pharynx without exudate.   Neck: Supple. Trachea midline. No thyromegaly. Full ROM. No lymphadenopathy. Lungs: Clear to auscultation bilaterally without wheezes, rales, or rhonchi. Breathing is of normal effort and unlabored. Cardiovascular: RRR with S1 S2. No murmurs, rubs, or gallops appreciated. Distal pulses 2+ symmetrically. No carotid or abdominal bruits Abdomen: Soft, non-tender, non-distended with normoactive bowel sounds. No hepatosplenomegaly or masses. No rebound/guarding. No CVA tenderness. Without hernias.  Rectal: No external hemorrhoids or fissures. Rectal vault without masses.  Genitourinary:  circumcised male. No penile lesions. Testes descended bilaterally, and smooth without tenderness or masses.  Musculoskeletal: Full range of motion and 5/5 strength throughout. Without swelling, atrophy, tenderness, crepitus, or warmth. Extremities without clubbing, cyanosis, or edema. Calves supple. Skin: Warm and moist without erythema, ecchymosis, wounds, or rash. Neuro: A+Ox3. CN II-XII grossly intact. Moves all extremities spontaneously. Full sensation throughout. Normal gait. DTR 2+ throughout upper and lower extremities. Finger to nose intact. Psych:  Responds to questions appropriately with a normal affect.   Assessment/Plan:  57 y.o. y/o  male here for CPE -Annual physical exam - Plan: POCT CBC, IFOBT POC (occult bld, rslt in office), POCT urinalysis dipstick, Comprehensive metabolic panel, Lipid panel, PSA    Signed, Elvina Sidle, MD 11/03/2013 8:59 AM

## 2013-11-04 LAB — PSA: PSA: 1.96 ng/mL (ref <=4.00)

## 2013-11-04 NOTE — Progress Notes (Signed)
Second note for this patient.  Please refer to other entry

## 2014-03-18 ENCOUNTER — Other Ambulatory Visit: Payer: Self-pay | Admitting: Family Medicine

## 2014-03-18 NOTE — Telephone Encounter (Signed)
Dr L, I see that you have written for RFs before for prednisone to have on hand for chronic LBP. Do you want to RF that and for Epi Pen?

## 2014-06-02 ENCOUNTER — Ambulatory Visit (INDEPENDENT_AMBULATORY_CARE_PROVIDER_SITE_OTHER): Payer: 59 | Admitting: Emergency Medicine

## 2014-06-02 VITALS — BP 128/72 | HR 70 | Temp 98.0°F | Resp 14 | Ht 65.0 in | Wt 199.6 lb

## 2014-06-02 DIAGNOSIS — M5430 Sciatica, unspecified side: Secondary | ICD-10-CM

## 2014-06-02 MED ORDER — LIDOCAINE 5 % EX PTCH: 1.0000 | MEDICATED_PATCH | CUTANEOUS | Status: DC

## 2014-06-02 MED ORDER — PREDNISONE 10 MG PO KIT: PACK | ORAL | Status: DC

## 2014-06-02 NOTE — Addendum Note (Signed)
Addended by: Carmelina DaneANDERSON, Taneasha Fuqua S on: 06/02/2014 03:03 PM   Modules accepted: Orders

## 2014-06-02 NOTE — Patient Instructions (Signed)
Sciatica °Sciatica is pain, weakness, numbness, or tingling along the path of the sciatic nerve. The nerve starts in the lower back and runs down the back of each leg. The nerve controls the muscles in the lower leg and in the back of the knee, while also providing sensation to the back of the thigh, lower leg, and the sole of your foot. Sciatica is a symptom of another medical condition. For instance, nerve damage or certain conditions, such as a herniated disk or bone spur on the spine, pinch or put pressure on the sciatic nerve. This causes the pain, weakness, or other sensations normally associated with sciatica. Generally, sciatica only affects one side of the body. °CAUSES  °· Herniated or slipped disc. °· Degenerative disk disease. °· A pain disorder involving the narrow muscle in the buttocks (piriformis syndrome). °· Pelvic injury or fracture. °· Pregnancy. °· Tumor (rare). °SYMPTOMS  °Symptoms can vary from mild to very severe. The symptoms usually travel from the low back to the buttocks and down the back of the leg. Symptoms can include: °· Mild tingling or dull aches in the lower back, leg, or hip. °· Numbness in the back of the calf or sole of the foot. °· Burning sensations in the lower back, leg, or hip. °· Sharp pains in the lower back, leg, or hip. °· Leg weakness. °· Severe back pain inhibiting movement. °These symptoms may get worse with coughing, sneezing, laughing, or prolonged sitting or standing. Also, being overweight may worsen symptoms. °DIAGNOSIS  °Your caregiver will perform a physical exam to look for common symptoms of sciatica. He or she may ask you to do certain movements or activities that would trigger sciatic nerve pain. Other tests may be performed to find the cause of the sciatica. These may include: °· Blood tests. °· X-rays. °· Imaging tests, such as an MRI or CT scan. °TREATMENT  °Treatment is directed at the cause of the sciatic pain. Sometimes, treatment is not necessary  and the pain and discomfort goes away on its own. If treatment is needed, your caregiver may suggest: °· Over-the-counter medicines to relieve pain. °· Prescription medicines, such as anti-inflammatory medicine, muscle relaxants, or narcotics. °· Applying heat or ice to the painful area. °· Steroid injections to lessen pain, irritation, and inflammation around the nerve. °· Reducing activity during periods of pain. °· Exercising and stretching to strengthen your abdomen and improve flexibility of your spine. Your caregiver may suggest losing weight if the extra weight makes the back pain worse. °· Physical therapy. °· Surgery to eliminate what is pressing or pinching the nerve, such as a bone spur or part of a herniated disk. °HOME CARE INSTRUCTIONS  °· Only take over-the-counter or prescription medicines for pain or discomfort as directed by your caregiver. °· Apply ice to the affected area for 20 minutes, 3 4 times a day for the first 48 72 hours. Then try heat in the same way. °· Exercise, stretch, or perform your usual activities if these do not aggravate your pain. °· Attend physical therapy sessions as directed by your caregiver. °· Keep all follow-up appointments as directed by your caregiver. °· Do not wear high heels or shoes that do not provide proper support. °· Check your mattress to see if it is too soft. A firm mattress may lessen your pain and discomfort. °SEEK IMMEDIATE MEDICAL CARE IF:  °· You lose control of your bowel or bladder (incontinence). °· You have increasing weakness in the lower back,   pelvis, buttocks, or legs. °· You have redness or swelling of your back. °· You have a burning sensation when you urinate. °· You have pain that gets worse when you lie down or awakens you at night. °· Your pain is worse than you have experienced in the past. °· Your pain is lasting longer than 4 weeks. °· You are suddenly losing weight without reason. °MAKE SURE YOU: °· Understand these  instructions. °· Will watch your condition. °· Will get help right away if you are not doing well or get worse. °Document Released: 11/30/2001 Document Revised: 06/06/2012 Document Reviewed: 04/16/2012 °ExitCare® Patient Information ©2014 [employer]. ° °

## 2014-06-02 NOTE — Progress Notes (Signed)
[facility] [address], [place] [id number] [phone]  Date:  06/02/2014   Name:  Chad Mcfarland   DOB:  [birth year]   MRN:  [id number]  PCP:  Elvina SidleLAUENSTEIN,KURT, MD    Chief Complaint: Back Pain   History of Present Illness:  Chad Mcfarland is a 58 y.o. 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  Has a chronic pain in low back with radiation into right thigh.  Has burning pain and hypersensitivity in thigh.  No weakness.  Has prior MRI and was offered surgery vs epidurals and chose no intervention.  Requesting a dose pak.  No improvement with over the counter medications or other home remedies. Denies other complaint or health concern today.   There are no active problems to display for this patient.   Past Medical History  Diagnosis Date  . Kidney stone     Past Surgical History  Procedure Laterality Date  . Vasectomy    . Nasal sinus surgery      History  Substance Use Topics  . Smoking status: Former Smoker    Types: Cigarettes    Quit date: 02/25/1997  . Smokeless tobacco: Not on file  . Alcohol Use: Yes    Family History  Problem Relation Age of Onset  . Heart disease Mother   . Diabetes Mother   . Diabetes Sister     Allergies  Allergen Reactions  . Codeine   . Tetracyclines & Related     Medication list has been reviewed and updated.  Current Outpatient Prescriptions on File Prior to Visit  Medication Sig Dispense Refill  . aspirin 81 MG tablet Take 81 mg by mouth daily.      . Coenzyme Q10 (CO Q 10) 100 MG CAPS Take by mouth.      . EPIPEN 2-PAK 0.3 MG/0.3ML SOAJ injection USE AS DIRECTED.  2 Device  2  . fish oil-omega-3 fatty acids 1000 MG capsule Take 2 g by mouth daily.      . Multiple Vitamin (MULTIVITAMIN) tablet Take 1 tablet by mouth daily.      . niacin 500 MG tablet Take 500 mg  by mouth daily with breakfast.      . OVER THE COUNTER MEDICATION Glucosamine 150 mg one daily      . predniSONE (DELTASONE) 20 MG tablet TAKE 2 TABLETS BY MOUTH ONCE DAILY WITH FOOD FOR BACK PAIN.  10 tablet  2  . Red Yeast Rice 600 MG CAPS Take by mouth.       No current facility-administered medications on file prior to visit.    Review of Systems:  As per HPI, otherwise negative.    Physical Examination: Filed Vitals:   06/02/14 1425  BP: 128/72  Pulse: 70  Temp: 98 F (36.7 C)  Resp: 14   Filed Vitals:   06/02/14 1425  Height: 5\' 5"  (1.651 m)  Weight: 199 lb 9.6 oz (90.538 kg)   Body mass index is 33.22 kg/(m^2). Ideal Body Weight: Weight in (lb) to have BMI = 25: 149.9   GEN: WDWN, NAD, Non-toxic, Alert & Oriented x 3 HEENT: Atraumatic, Normocephalic.  Ears and Nose: No external deformity. EXTR: No clubbing/cyanosis/edema NEURO: Normal gait.   DTR's intact and 2+ lowers.  Normal motor strength. PSYCH: Normally interactive. Conversant. Not depressed  or anxious appearing.  Calm demeanor.  Back:  Tender right sciatic notch.   Assessment and Plan: Sciatic neuritis sterapred ds Lidocaine patches Neuro referral   Signed,  Phillips OdorJeffery Saniyah Mondesir, MD

## 2014-06-04 ENCOUNTER — Telehealth: Payer: Self-pay

## 2014-06-04 NOTE — Telephone Encounter (Signed)
PA needed for Lidocaine patches. Completed form and faxed to ins. Pt advised he has taken ibuprofen, aleve, oxycodone in the past. Also uses prednisone w/flare-ups.

## 2014-06-06 NOTE — Telephone Encounter (Signed)
Received another form from ins and completed and faxed back.

## 2014-06-07 ENCOUNTER — Telehealth: Payer: Self-pay

## 2014-06-07 NOTE — Telephone Encounter (Signed)
Patient ID: [id number]  Time: 06/07/2014 12:08 PM Patient Name: Chad Mcfarland, Matas  Based on the clinical information provided, this request has not demonstrated consistency with evidence-based clinical guidelines. Your Notification number request has been assigned Case Number [id number].  A Physician-to-Physician discussion is required to complete the Notification Process. The ordering physician must call [phone] and select option #3 to engage in a Physician-to-Physician discussion. Please ensure the physician has the case number provided above available when making this call. If a Physician-to-Physician discussion is not conducted within 3 business days, this notification number request will be deemed expired and you must reinitiate the Notification Process.  To view the current status of this request, please visit [email or web address] and select Radiology Notification and Status under the Notifications menu or call [employer] at [phone].

## 2014-06-11 NOTE — Telephone Encounter (Signed)
Notification #: [id number]  Valid for 45 calendar days, expires 07/26/14, not a guarantee of payment

## 2014-06-13 NOTE — Telephone Encounter (Signed)
Called to check status of PA. PA was denied, not covered for pt's Dx. Notified pharmacy.

## 2014-06-15 ENCOUNTER — Ambulatory Visit
Admission: RE | Admit: 2014-06-15 | Discharge: 2014-06-15 | Disposition: A | Discharge status: Home / Self Care | Payer: 59 | Source: Ambulatory Visit | Attending: Emergency Medicine | Admitting: Emergency Medicine

## 2014-06-15 DIAGNOSIS — M5430 Sciatica, unspecified side: Secondary | ICD-10-CM

## 2014-06-16 ENCOUNTER — Other Ambulatory Visit: Payer: Self-pay | Admitting: Emergency Medicine

## 2014-06-16 DIAGNOSIS — M5431 Sciatica, right side: Secondary | ICD-10-CM

## 2014-11-07 ENCOUNTER — Encounter: Payer: Self-pay | Admitting: Family Medicine

## 2014-11-07 ENCOUNTER — Ambulatory Visit (INDEPENDENT_AMBULATORY_CARE_PROVIDER_SITE_OTHER): Payer: 59 | Admitting: Family Medicine

## 2014-11-07 ENCOUNTER — Ambulatory Visit (INDEPENDENT_AMBULATORY_CARE_PROVIDER_SITE_OTHER): Payer: 59

## 2014-11-07 VITALS — BP 120/72 | HR 74 | Temp 98.3°F | Resp 16 | Ht 65.5 in | Wt 178.0 lb

## 2014-11-07 DIAGNOSIS — R0602 Shortness of breath: Secondary | ICD-10-CM

## 2014-11-07 DIAGNOSIS — Z0000 Encounter for general adult medical examination without abnormal findings: Secondary | ICD-10-CM

## 2014-11-07 DIAGNOSIS — R42 Dizziness and giddiness: Secondary | ICD-10-CM

## 2014-11-07 LAB — LIPID PANEL
Cholesterol: 141 mg/dL (ref 0–200)
HDL: 55 mg/dL (ref >39)
LDL Cholesterol: 72 mg/dL (ref 0–99)
Total CHOL/HDL Ratio: 2.6 Ratio
Triglycerides: 69 mg/dL (ref <150)
VLDL: 14 mg/dL (ref 0–40)

## 2014-11-07 LAB — POCT URINALYSIS DIPSTICK
Bilirubin, UA: NEGATIVE
Blood, UA: NEGATIVE
Glucose, UA: NEGATIVE
Ketones, UA: NEGATIVE
Leukocytes, UA: NEGATIVE
Nitrite, UA: NEGATIVE
Protein, UA: NEGATIVE
Spec Grav, UA: 1.015
Urobilinogen, UA: 0.2
pH, UA: 7

## 2014-11-07 LAB — COMPLETE METABOLIC PANEL WITH GFR
ALT: 18 U/L (ref 0–53)
AST: 19 U/L (ref 0–37)
Albumin: 4 g/dL (ref 3.5–5.2)
Alkaline Phosphatase: 56 U/L (ref 39–117)
BUN: 14 mg/dL (ref 6–23)
CO2: 25 mEq/L (ref 19–32)
Calcium: 8.8 mg/dL (ref 8.4–10.5)
Chloride: 106 mEq/L (ref 96–112)
Creat: 0.84 mg/dL (ref 0.50–1.35)
GFR, Est African American: >89 mL/min
GFR, Est Non African American: >89 mL/min
Glucose, Bld: 100 mg/dL — ABNORMAL HIGH (ref 70–99)
Potassium: 4.2 mEq/L (ref 3.5–5.3)
Sodium: 141 mEq/L (ref 135–145)
Total Bilirubin: 0.6 mg/dL (ref 0.2–1.2)
Total Protein: 6.3 g/dL (ref 6.0–8.3)

## 2014-11-07 LAB — CBC WITH DIFFERENTIAL/PLATELET
Basophils Absolute: 0 10*3/uL (ref 0.0–0.1)
Basophils Relative: 0 % (ref 0–1)
Eosinophils Absolute: 0.1 10*3/uL (ref 0.0–0.7)
Eosinophils Relative: 2 % (ref 0–5)
HCT: 42.6 % (ref 39.0–52.0)
Hemoglobin: 14.8 g/dL (ref 13.0–17.0)
Lymphocytes Relative: 37 % (ref 12–46)
Lymphs Abs: 1.9 10*3/uL (ref 0.7–4.0)
MCH: 32.5 pg (ref 26.0–34.0)
MCHC: 34.7 g/dL (ref 30.0–36.0)
MCV: 93.4 fL (ref 78.0–100.0)
MPV: 10.4 fL (ref 9.4–12.4)
Monocytes Absolute: 0.4 10*3/uL (ref 0.1–1.0)
Monocytes Relative: 8 % (ref 3–12)
Neutro Abs: 2.7 10*3/uL (ref 1.7–7.7)
Neutrophils Relative %: 53 % (ref 43–77)
Platelets: 133 10*3/uL — ABNORMAL LOW (ref 150–400)
RBC: 4.56 MIL/uL (ref 4.22–5.81)
RDW: 14.3 % (ref 11.5–15.5)
WBC: 5 10*3/uL (ref 4.0–10.5)

## 2014-11-07 LAB — IFOBT (OCCULT BLOOD): IFOBT: NEGATIVE

## 2014-11-07 NOTE — Progress Notes (Addendum)
° °  Subjective:    Patient ID: Chad Mcfarland, male    DOB: 1956-06-19, 58 y.o.   MRN: [id number] This chart was scribed for Chad SidleKurt Lauenstein, MD by Jolene Provostobert Halas, Medical Scribe. This patient was seen in Room 25 and the patient's care was started a 8:28 AM.   HPI  HPI Comments: Chad BaltimoreRonald W Mcfarland is a 58 y.o. male who [facility] a general checkup. Pt states that his position has changed, he has been waling 10-15 miles per day. Due to his walking he has lost significant weight. Pt's last colonoscopy was in 2006. Pt had a recent appointment with Dr. Danielle DessElsner to look at his lower back, who told him he may have a torn ligament on L5.   Pt complains that he has been having a gurgling in his throat with talking, and is concerned that he may have issues associated with his former smoking. Pt states he has also been experiencing "wooziness" when he walks a few miles.   Pt also endorses occasional moderate to severe right foot pain under the first metatarsal with walking. Pt denies MOI. Pt states he believes that the pain was caused by poorly fitting shoes. Pt states he can feel something shift in his foot when the pain begins. Pt states he uses aleve three days per week for his back and foot pain.  Works for [employer], Wife is Publishing rights managernurse practitioner  Review of Systems  Constitutional: Negative for fever and chills.  HENT: Negative for congestion.   Respiratory: Positive for shortness of breath and wheezing.   Musculoskeletal:       Right foot pain.  Skin: Negative for wound.  Neurological: Positive for light-headedness.       Objective:   Physical Exam  Constitutional: He is oriented to person, place, and time. He appears well-developed and well-nourished.  HENT:  Head: Normocephalic and atraumatic.  Right Ear: Tympanic membrane and ear canal normal.  Left Ear: Tympanic membrane and ear canal normal.  Nose: Nose normal.  Mouth/Throat: Oropharynx is clear and moist.  Eyes:  Pupils are equal, round, and reactive to light.  Neck: Neck supple.  Negative carotid bruit.  Cardiovascular: Normal rate, regular rhythm and normal heart sounds.   Pulmonary/Chest: Effort normal and breath sounds normal. No respiratory distress.  Slight wheeze.  Genitourinary:  Negative hernia test  Musculoskeletal: He exhibits no tenderness.  Neurological: He is alert and oriented to person, place, and time.  Skin: Skin is warm and dry.  Psychiatric: He has a normal mood and affect. His behavior is normal.  Nursing note and vitals reviewed.  [facility] reading (PRIMARY) by  Dr. Milus GlazierLauenstein:  Normal chest.     Assessment & Plan:  Routine general medical examination at a health care facility - Plan: CBC with Differential, COMPLETE METABOLIC PANEL WITH GFR, Lipid panel, POCT urinalysis dipstick, IFOBT POC (occult bld, rslt in office), DG Chest 2 View  Shortness of breath - Plan: CT Chest Wo Contrast  Dizziness and giddiness - Plan: CT Chest Wo Contrast Above note reviewed and approved by: Signed, Chad SidleKurt Lauenstein, MD  This chart was scribed in my presence and reviewed by me personally.

## 2014-11-09 ENCOUNTER — Encounter: Payer: Self-pay | Admitting: *Deleted

## 2014-11-19 ENCOUNTER — Ambulatory Visit
Admission: RE | Admit: 2014-11-19 | Discharge: 2014-11-19 | Disposition: A | Discharge status: Home / Self Care | Payer: 59 | Source: Ambulatory Visit | Attending: Family Medicine | Admitting: Family Medicine

## 2014-11-19 DIAGNOSIS — R42 Dizziness and giddiness: Secondary | ICD-10-CM

## 2014-11-19 DIAGNOSIS — R0602 Shortness of breath: Secondary | ICD-10-CM

## 2015-03-14 ENCOUNTER — Ambulatory Visit (INDEPENDENT_AMBULATORY_CARE_PROVIDER_SITE_OTHER): Payer: 59

## 2015-03-14 ENCOUNTER — Ambulatory Visit: Payer: 59

## 2015-03-14 ENCOUNTER — Ambulatory Visit (INDEPENDENT_AMBULATORY_CARE_PROVIDER_SITE_OTHER): Payer: 59 | Admitting: Family Medicine

## 2015-03-14 VITALS — BP 104/68 | HR 62 | Temp 97.9°F | Resp 16 | Ht 67.0 in | Wt 182.0 lb

## 2015-03-14 DIAGNOSIS — M79672 Pain in left foot: Secondary | ICD-10-CM | POA: Diagnosis not present

## 2015-03-14 DIAGNOSIS — H65191 Other acute nonsuppurative otitis media, right ear: Secondary | ICD-10-CM | POA: Diagnosis not present

## 2015-03-14 MED ORDER — AMOXICILLIN-POT CLAVULANATE 875-125 MG PO TABS: 1.0000 | ORAL_TABLET | Freq: Two times a day (BID) | ORAL | Status: DC

## 2015-03-14 NOTE — Progress Notes (Signed)
° °  Subjective:    Patient ID: Chad Mcfarland, male    DOB: [birth year], 59 y.o.   MRN: [id number] This chart was scribed for [name], MD by Jolene Provostobert Halas, Medical Scribe. This patient was seen in Room 1 and the patient's care was started a 3:01 PM.  Chief Complaint  Patient presents with   Ear Fullness    minor ringing, right, x 10 days   Sinusitis   Foot Injury    left, pain, x 1 week    HPI HPI Comments: Chad BaltimoreRonald W Kohan is a 59 y.o. male who [facility] complaining of ear fullness, with associated tinnitus and pain for the last 5 days, that started after he blew his nose.   Pt also states he has pain in his left ankle, with associated swelling. Pt states if he stands for a long period of time the pain becomes severe. Pt states he is not aware of a MOI.     Review of Systems  Constitutional: Negative for fever and chills.  HENT: Positive for ear pain.   Musculoskeletal: Positive for joint swelling and arthralgias.       Objective:   Physical Exam  Constitutional: He is oriented to person, place, and time. He appears well-developed and well-nourished. No distress.  HENT:  Head: Normocephalic and atraumatic.  Right Ear: External ear normal.  Left Ear: External ear normal.  Reddish amber colored fluid behind right ear. Left TM normal.  Eyes: Pupils are equal, round, and reactive to light.  Neck: Neck supple.  Cardiovascular: Normal rate.   Pulmonary/Chest: Effort normal. No respiratory distress.  Musculoskeletal: Normal range of motion.  Neurological: He is alert and oriented to person, place, and time. Coordination normal.  Skin: Skin is warm and dry. He is not diaphoretic.  Psychiatric: He has a normal mood and affect. His behavior is normal.  Nursing note and vitals reviewed.  [facility] reading (PRIMARY) by  Dr. Milus GlazierLauenstein:  Normal left foot x-ray.      Assessment & Plan:   This chart was scribed in my presence and reviewed by me personally.   ICD-9-CM ICD-10-CM   1. Acute nonsuppurative otitis media of right ear 381.00 H65.191 amoxicillin-clavulanate (AUGMENTIN) 875-125 MG per tablet  2. Left foot pain 729.5 M79.672 DG Foot Complete Left     DG Foot Complete Left     Ambulatory referral to Sports Medicine     CANCELED: DG Foot Complete Left     Signed, [name], MD

## 2015-03-21 ENCOUNTER — Telehealth: Payer: Self-pay

## 2015-03-21 NOTE — Telephone Encounter (Signed)
Patient was recently for seen for an each ache and his ear and was told by Dr. Milus GlazierLauenstein to follow up with us if it's not any better. Patient states that his ear is no longer in pain but there is a lot of pressure in his ear. Patient has an upcoming flight and would like to be advised on what to do. Please call! [phone]

## 2015-03-22 ENCOUNTER — Other Ambulatory Visit: Payer: Self-pay | Admitting: Family Medicine

## 2015-03-22 DIAGNOSIS — H65199 Other acute nonsuppurative otitis media, unspecified ear: Secondary | ICD-10-CM

## 2015-03-22 MED ORDER — FLUTICASONE PROPIONATE 50 MCG/ACT NA SUSP: 2.0000 | Freq: Every day | NASAL | Status: DC

## 2015-03-22 NOTE — Telephone Encounter (Signed)
Patient checking status of message. His ear is still bothering him and he has to get on an airplane Monday afternoon. Cb# [phone] (work cell) [facility] but if rx called in later [facility] [address]

## 2015-03-22 NOTE — Telephone Encounter (Signed)
Dr L- Pt would like to know what to do for pressure in his ear.

## 2015-03-23 NOTE — Telephone Encounter (Signed)
Patient called and states that [facility]on[facility] to [facility] [address] however they do not have any record of this medication. Looked in patient's chart and saw that Flonase was sent to [facility] instead. Advised patient that I would leave a message for his medication to be sent to [facility] [address]. He then replied "so this is going to take an hour or two? Just cancel the damn prescription" and he hung up. Just a FYI.  [phone]

## 2015-05-01 ENCOUNTER — Encounter: Payer: Self-pay | Admitting: Sports Medicine

## 2015-05-01 ENCOUNTER — Ambulatory Visit (INDEPENDENT_AMBULATORY_CARE_PROVIDER_SITE_OTHER): Payer: 59 | Admitting: Sports Medicine

## 2015-05-01 VITALS — BP 111/71 | HR 80 | Ht 66.0 in | Wt 183.0 lb

## 2015-05-01 DIAGNOSIS — M84375A Stress fracture, left foot, initial encounter for fracture: Secondary | ICD-10-CM

## 2015-05-01 DIAGNOSIS — M8430XA Stress fracture, unspecified site, initial encounter for fracture: Secondary | ICD-10-CM

## 2015-05-01 NOTE — Progress Notes (Signed)
CCX: left foot pain  Referred courtesy of Dr Milus GlazierLauenstein  HPI: Mr. Chad Mcfarland is a 59 year old avid walker who comes in with left foot pain for approximately 6 months. He says that he has a tender bump that will develop on the top part of his left foot that he can feel with palpation. It will last for 1-2 days and then go away for a few weeks and then return. At its maximum, its about the diameter of a quarter and appears to push the skin up about 1-2 mm. No erythema or swelling besides the bump. He has a XR done by his PCP that showed possible cyst.  This weekend during his walk, he noticed some pain under his foot, and then later the whole left side of his foot was throbbing and the bump had returned. He was unable to sleep that night because of the pain. Today that pain has resolved. He notices that the bump appears on days when he happens to have an increase in his walking. He usually doesn't have pain with walking, but the bump is always very tender. He initially was walking 12-13 miles a day, but has cut back to 8-9 miles a day (3 2.5-293mi walk a day).   PEx: Gen: Well-appearing, healthy male. CV: Pedal pulses strong and equal bilaterally. MSK: No obvious deformities of foot. Arches fall slightly when standing. Point tenderness at base of 4th metatarsal. No obvious bump felt on palpation. Normal ROM. Normal strength. Normal gait. Neuro: Sensation intact bilaterally.  Imaging: U/S shows hypoechoic, circular area at the base of the 4th metatarsal with hyperechoic material inside consistent with callous formation. Joint space normal. Circular area is not compressible with ultrasound probe. Increased blood flow to bone and tissue surrounding circular area and base of 4th metatarsal.  Assessment:  59 yo with left foot pain at the base of the 4th metatarsal with findings on ultrasound consistent with stress fracture of left base of the 4th metatarsal with a pseudocyst surrounding the callous vs a  ganglion cyst. Repeat U/S to be performed at next visit to re-evaluate to see if pseudocyst or ganglion cyst.  1. Stress fracture, initial encounter - decrease daily walking, and increase slowly to goal walks: Week 1: 8000 steps, Week 2: 9000 steps, Week 3: 11000 steps, Week 4: 13000 steps, Week 5: 13000 steps, Week 6: 15000 steps - use arch support strap over tender area when walking. - arch support placed in insoles today - put ice on top of foot after walking  Follow-up appointment in 6 weeks for ultrasound to evaluate foot.   Karmen StabsE. Paige Brannen Koppen, MD [facility], PGY-1 05/01/2015  1:53 PM    Agree with evaluation and documentation.  Sterling BigKB Fields, MD

## 2015-05-01 NOTE — Assessment & Plan Note (Signed)
Use of arch strap  Relative rest with walking reduction  Good shoe support  Reck 6 wks or if worsening sxs

## 2015-05-01 NOTE — Patient Instructions (Signed)
Week 1: 8000 steps Week 2: 9000 steps Week 3: 11000 steps Week 4: 13000 steps Week 5: 13000 steps Week 6: 15000 steps  Use strap when walking.  Put ice on top right after walking.  Follow-up appointment in 6 weeks.

## 2015-06-18 ENCOUNTER — Encounter: Payer: Self-pay | Admitting: Sports Medicine

## 2015-06-18 ENCOUNTER — Ambulatory Visit (INDEPENDENT_AMBULATORY_CARE_PROVIDER_SITE_OTHER): Payer: 59 | Admitting: Sports Medicine

## 2015-06-18 VITALS — BP 125/82 | HR 66 | Ht 66.0 in | Wt 185.0 lb

## 2015-06-18 DIAGNOSIS — M71372 Other bursal cyst, left ankle and foot: Secondary | ICD-10-CM

## 2015-06-18 DIAGNOSIS — M84375A Stress fracture, left foot, initial encounter for fracture: Secondary | ICD-10-CM

## 2015-06-18 DIAGNOSIS — M7130 Other bursal cyst, unspecified site: Secondary | ICD-10-CM | POA: Insufficient documentation

## 2015-06-18 MED ORDER — TRIAMCINOLONE ACETONIDE 10 MG/ML IJ SUSP
5.0000 mg | Freq: Once | INTRAMUSCULAR | Status: AC
  Administered 2015-06-18: 5 mg via INTRA_ARTICULAR

## 2015-06-18 NOTE — Assessment & Plan Note (Signed)
Procedure:  Injection of Consent obtained and verified. Time-out conducted. Noted no overlying erythema, induration, or other signs of local infection. Skin prepped in a sterile fashion. Topical analgesic spray: Ethyl chloride. Completed without difficulty.  Under US guidance I directed injection into the cyst and we observed medication filling cavity between MT 4 and 5 Meds:kenalog 10 - one half cc and 1/2 cc lidocaine Pain immediately improved suggesting accurate placement of the medication.  Cautioned about signs of infection  rescan in 1 mo  Cyst felt firm and may be gelatinous Advised to call if fevers/chills, erythema, induration, drainage, or persistent bleeding.

## 2015-06-18 NOTE — Progress Notes (Signed)
Patient ID: Chad BaltimoreRonald W Mcfarland, male   DOB: 06/05/1956, 59 y.o.   MRN: [id number]  From last visit we reduced walking but still did at least 6000 steps but one day hit [id number] steps Not much pain with walking Did have pain with twisting the foot Using arch strap/ also sports insole support  Avid walker for weight loss (at one time he was very heavy) Using a strap Sports insole really helped with support  Since twisting foot in kitchen last week more pain laterally between 4/5 MT  Exam NAD BP 125/82 mmHg  Pulse 66  Ht 5\' 6"  (1.676 m)  Wt 185 lb (83.915 kg)  BMI 29.87 kg/m2  No obvious swelling or redness over foot No TTP over MT 3 / 4 / 5 Walks with no limp Slight squeeze pain along lateral shaft of 4  US Area of bony irregularity over MT is now fused and smoother x one calcific area Less swelling over shaft Between MT 4 and 5 mid shaft is a hypoechoic swelling measuring 1 cm by 1.2 cm with capsular margins

## 2015-06-18 NOTE — Assessment & Plan Note (Signed)
Bone area appears healed and not painful today  We should plan on custom orthotics in future with his walking level

## 2015-07-24 ENCOUNTER — Other Ambulatory Visit: Payer: Self-pay | Admitting: Emergency Medicine

## 2015-08-06 ENCOUNTER — Ambulatory Visit (INDEPENDENT_AMBULATORY_CARE_PROVIDER_SITE_OTHER): Payer: 59 | Admitting: Sports Medicine

## 2015-08-06 ENCOUNTER — Encounter: Payer: Self-pay | Admitting: Sports Medicine

## 2015-08-06 VITALS — BP 145/77 | HR 57 | Ht 66.0 in | Wt 186.0 lb

## 2015-08-06 DIAGNOSIS — M71372 Other bursal cyst, left ankle and foot: Secondary | ICD-10-CM

## 2015-08-06 DIAGNOSIS — M84375D Stress fracture, left foot, subsequent encounter for fracture with routine healing: Secondary | ICD-10-CM | POA: Diagnosis not present

## 2015-08-07 NOTE — Assessment & Plan Note (Signed)
4th/5th intermetarsal cyst no longer giving Chad Mcfarland trouble following his cortisone injection last month along with the activity modification - We have constructed orthotics  - He can f/u as needed.

## 2015-08-07 NOTE — Progress Notes (Signed)
Chad Mcfarland - 59 y.o. male MRN [id number]  Date of birth: 1956/02/19  CC: Foot Orthotics   SUBJECTIVE:   HPI  Patient follows up for left foot pain.  He is a walker who began having lateral left foot pain several months ago. He was found to have a stress fracture and a  large ganglion cyst between his 4th/5th metatarsals.   He walks between 6-8 miles daily for exercise. 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  - The injection of his ganglion cyst has helped greatly.  - He has been using the scaphoid pads with his insoles, which make him feel better.  - No longer having much pain with walking. He is up to 16000 steps/day - He would like to get orthotics.   - He has not been using the midfoot compression strap.    ROS:     Negative other than that in HPI.   HISTORY: Past Medical, Surgical, Social, and Family History Reviewed & Updated per EMR.  Pertinent Historical Findings include: Negative  OBJECTIVE: BP 145/77 mmHg  Pulse 57  Ht '5\' 6"'  (1.676 m)  Wt 186 lb (84.369 kg)  BMI 30.04 kg/m2  Physical Exam  Gen: calm, no distress Resp: non-labored breathing.   No obvious swelling or redness over foot No TTP over MT 3 / 4 / 5 Walks with no limp No squeeze pain   [place] of left foot: U/s of the ganglion cyst on left between MT 4/5 shows decreased size.  No evidence of previous midshaft MT fracture.     MEDICATIONS, LABS & OTHER ORDERS: Previous Medications   AMOXICILLIN-CLAVULANATE (AUGMENTIN) 875-125 MG PER TABLET    Take 1 tablet by mouth 2 (two) times daily.   ASPIRIN 81 MG TABLET    Take 81 mg by mouth daily.   COENZYME Q10 (CO Q 10) 100 MG CAPS    Take by mouth.   EPIPEN 2-PAK 0.3 MG/0.3ML SOAJ INJECTION    USE AS DIRECTED.   FISH OIL-OMEGA-3 FATTY ACIDS 1000 MG CAPSULE    Take 2 g by mouth daily.   FLUTICASONE (FLONASE) 50 MCG/ACT NASAL SPRAY    Place 2 sprays into both nostrils daily.   LIDOCAINE (LIDODERM) 5 %    Place 1 patch onto  the skin daily. Remove & Discard patch within 12 hours or as directed by MD   MULTIPLE VITAMIN (MULTIVITAMIN) TABLET    Take 1 tablet by mouth daily.   NIACIN 500 MG TABLET    Take 500 mg by mouth daily with breakfast.   PREDNISONE (DELTASONE) 20 MG TABLET    TAKE 2 TABLETS BY MOUTH ONCE DAILY WITH FOOD FOR BACK PAIN.   PREDNISONE 10 MG KIT    Use as directed on package   RED YEAST RICE 600 MG CAPS    Take by mouth.   Modified Medications   No medications on file   New Prescriptions   No medications on file   Discontinued Medications   No medications on file  No orders of the defined types were placed in this encounter.   ASSESSMENT & PLAN: Patient was fitted for a : standard, cushioned, semi-rigid orthotic. The orthotic was heated and afterward the patient stood on the orthotic blank positioned on the orthotic stand. The patient was positioned in subtalar neutral position and 10 degrees of ankle dorsiflexion in a weight bearing stance. After completion of molding, a stable base was applied to the orthotic  blank. The blank was ground to a stable position for weight bearing. Size: 8  Base: Blue EVA Posting: none Additional orthotic padding: none  Over 40 minutes were spent face to face with the patient with evaluation prior to orthotic construction and then in fabrication & adjustment of his orthotics.    See problem based charting & AVS for pt instructions.

## 2015-08-12 NOTE — Assessment & Plan Note (Signed)
This appears to have healed

## 2015-10-31 ENCOUNTER — Other Ambulatory Visit: Payer: Self-pay | Admitting: Emergency Medicine

## 2015-11-17 ENCOUNTER — Ambulatory Visit (INDEPENDENT_AMBULATORY_CARE_PROVIDER_SITE_OTHER): Payer: 59 | Admitting: Sports Medicine

## 2015-11-17 ENCOUNTER — Encounter: Payer: Self-pay | Admitting: Sports Medicine

## 2015-11-17 ENCOUNTER — Ambulatory Visit
Admission: RE | Admit: 2015-11-17 | Discharge: 2015-11-17 | Disposition: A | Discharge status: Home / Self Care | Payer: 59 | Source: Ambulatory Visit | Attending: Sports Medicine | Admitting: Sports Medicine

## 2015-11-17 VITALS — BP 110/70 | Ht 66.0 in | Wt 190.0 lb

## 2015-11-17 DIAGNOSIS — M71372 Other bursal cyst, left ankle and foot: Secondary | ICD-10-CM

## 2015-11-17 DIAGNOSIS — M84375D Stress fracture, left foot, subsequent encounter for fracture with routine healing: Secondary | ICD-10-CM | POA: Diagnosis not present

## 2015-11-17 NOTE — Assessment & Plan Note (Signed)
With recurrent foot pain MT stress fx again would be the likely clinical Dx However, with normal XR and no clear Stress Fx on US I think we need to be sure that there is not some other process  Would schedule MRI considering his atypical pattern  Use short Cam Walker  Reck 2 to 3 wks

## 2015-11-17 NOTE — Progress Notes (Signed)
Patient ID: Chad BaltimoreRonald W Mcfarland, male   DOB: 07/24/1956, 59 y.o.   MRN: [id number]  Patient has been treated for lateral left foot pain In June while doing lots of walking (> 20,000 steps per day) had a suspected stress fx Based on positive changes on US  Also noted a large hypoechoic cyst This improved with rest  I injected the cyst with Cortisone Pain improved with arch support, relative rest and CSI  We place him in orthotics and doing well with lots of walking until-  2 days ago playing golf / forgot to use orthotics By end of round had lots of pain lateral left foot Foot swelled and turned blue laterally Pain bad enough not able to walk on Saturday Icing and improved enough to walk slowly by today  comes for evaluation  ROS No fever No swelling of great toe or ankle No other joint pain No Hx of bone issues or pain  Exam NAD BP 110/70 mmHg  Ht 5\' 6"  (1.676 m)  Wt 190 lb (86.183 kg)  BMI 30.68 kg/m2  Left foot Diffuse swelling and discoloration lateral column of left foot Exquisitely TTP over base of 5th MT on dorsum Mild TTP between MT 4 and 5 Walks with limp  US Mild irregularity at base of 5th MT o long view/ not seen on transverse view Mild increase in doppler flow Large cystic lesion between MT 4 and 5  XRay - no clear fracture or boney irregularity/ cannot see cyst

## 2015-11-17 NOTE — Assessment & Plan Note (Signed)
This is a persistent large cyst  We need to check MRI to be sure not missing soft tissue tumor

## 2015-11-18 NOTE — Addendum Note (Signed)
Addended by: Annita BrodMOORE, Remus Hagedorn C on: 11/18/2015 09:37 AM   Modules accepted: Orders

## 2015-11-19 IMAGING — CR DG CHEST 2V
2 series · 2 of 2 positions shown · non-contrast
Comparison: None.

CLINICAL DATA: Former smoker, chest pain

EXAM:
CHEST  2 VIEW

[PA]
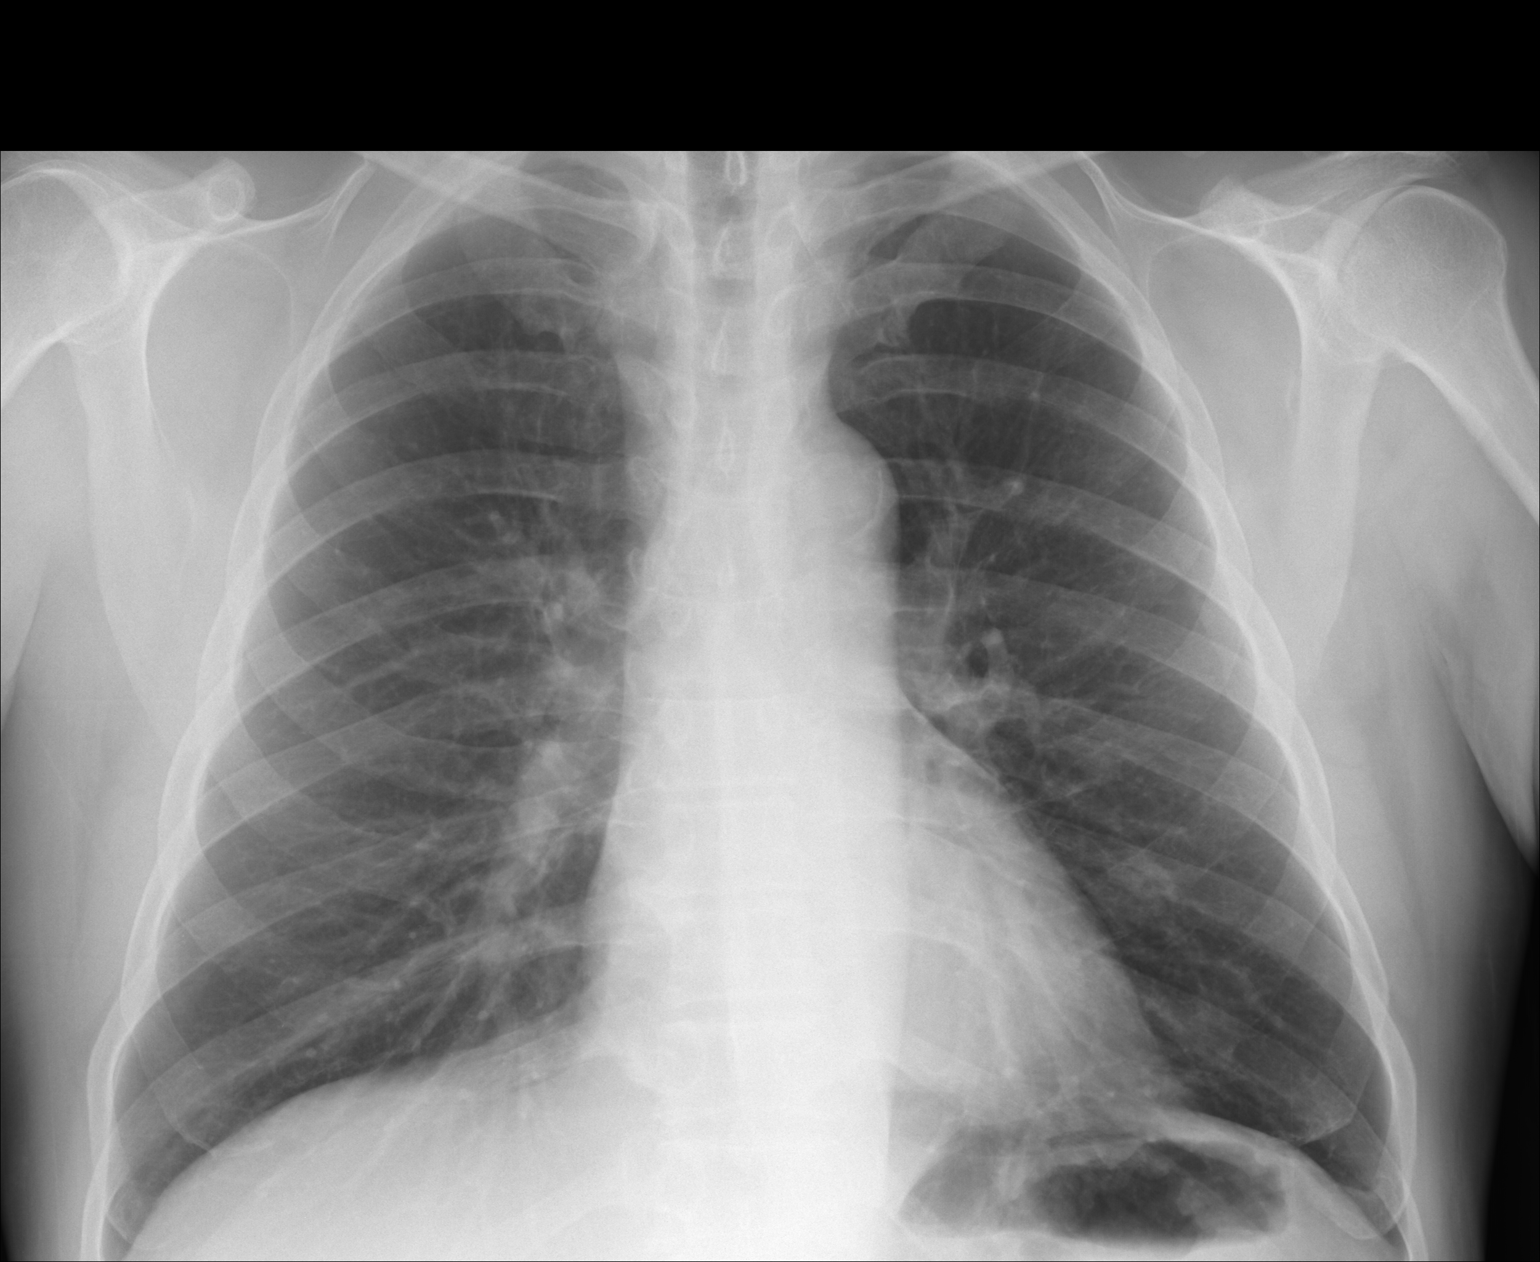

[lateral]
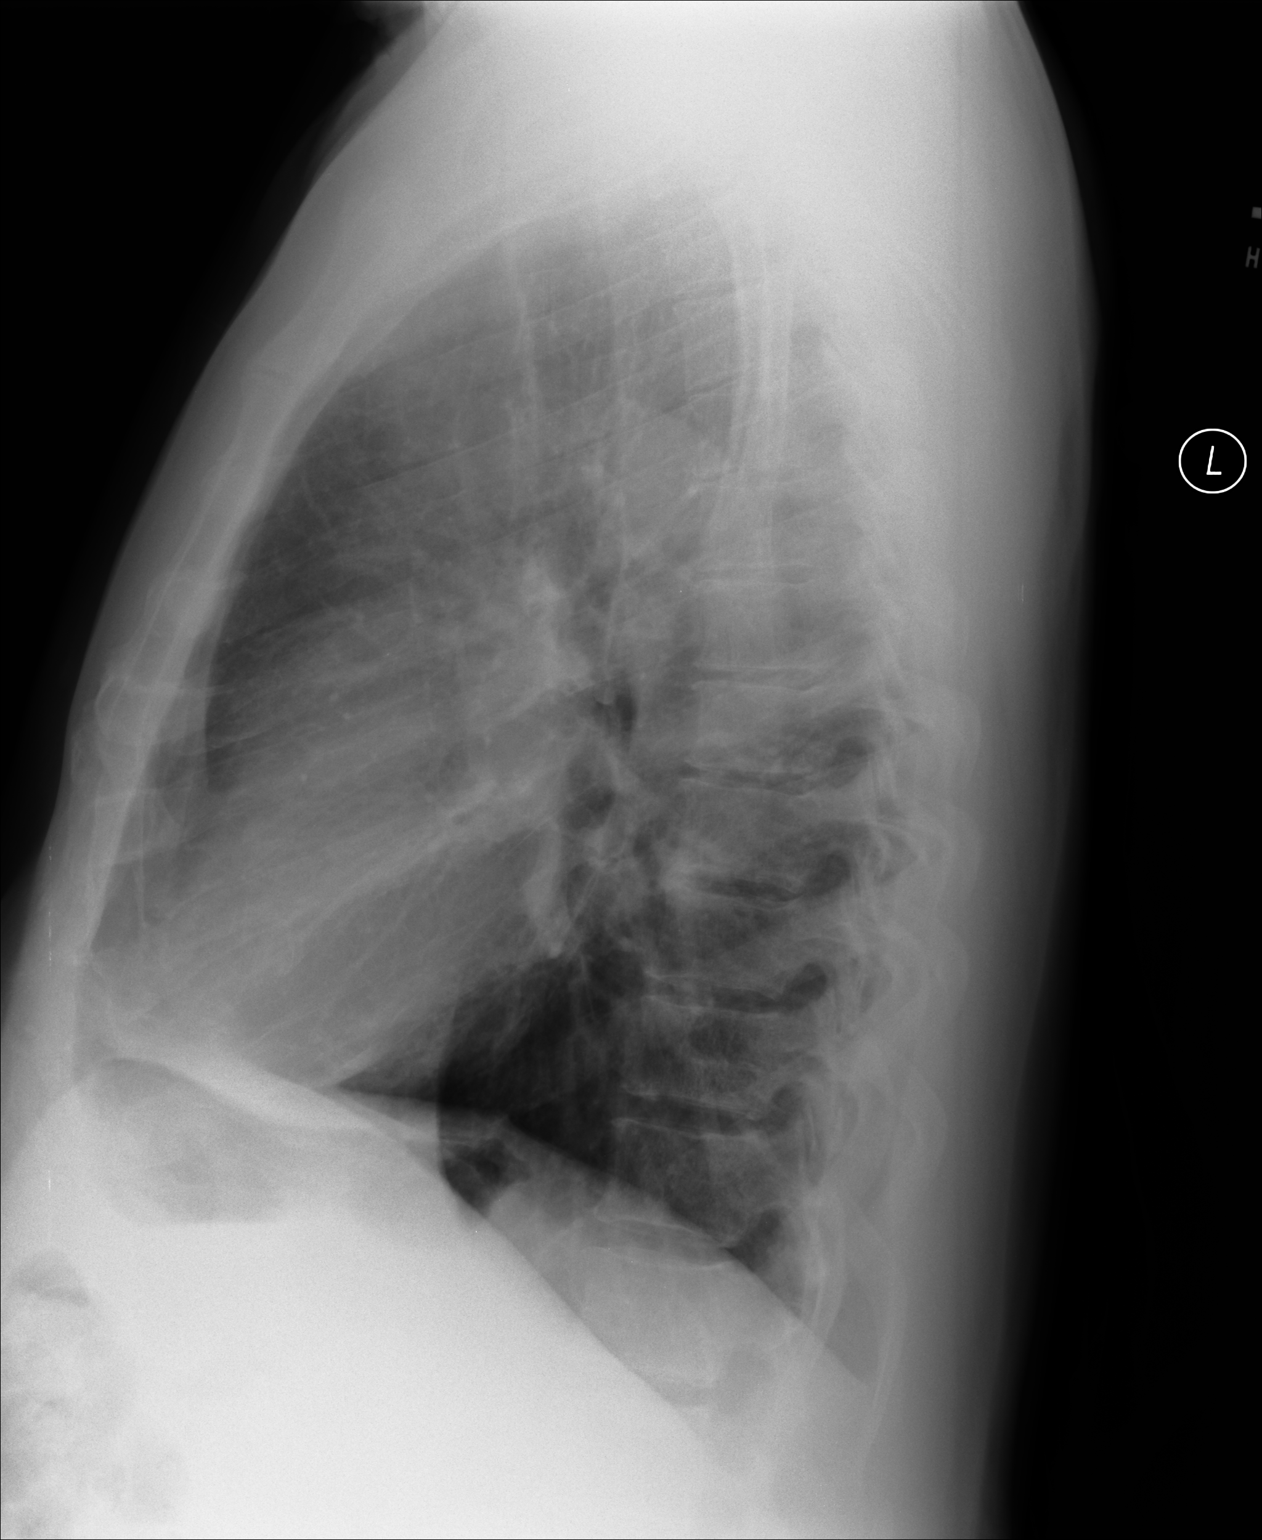

[2 of 2 positions shown; findings below may reference images not displayed]

FINDINGS: Normal mediastinum and cardiac silhouette. Normal pulmonary
vasculature. No evidence of effusion, infiltrate, or pneumothorax.
There is a 18 mm nodular density projecting over the left lower
lobe. No acute bony abnormality.
IMPRESSION: No acute cardiopulmonary process.

Potential left lower lobe pulmonary nodule. Recommend CT thorax
without contrast

These results will be called to the ordering clinician or
representative by the Radiologist Assistant, and communication
documented in the PACS or zVision Dashboard.

## 2015-11-22 ENCOUNTER — Ambulatory Visit
Admission: RE | Admit: 2015-11-22 | Discharge: 2015-11-22 | Disposition: A | Discharge status: Home / Self Care | Payer: 59 | Source: Ambulatory Visit | Attending: Sports Medicine | Admitting: Sports Medicine

## 2015-11-22 DIAGNOSIS — M71372 Other bursal cyst, left ankle and foot: Secondary | ICD-10-CM

## 2015-11-22 DIAGNOSIS — M84375D Stress fracture, left foot, subsequent encounter for fracture with routine healing: Secondary | ICD-10-CM

## 2015-12-05 ENCOUNTER — Encounter: Payer: Self-pay | Admitting: Family Medicine

## 2015-12-05 ENCOUNTER — Other Ambulatory Visit: Payer: 59

## 2015-12-05 ENCOUNTER — Ambulatory Visit (INDEPENDENT_AMBULATORY_CARE_PROVIDER_SITE_OTHER): Payer: 59 | Admitting: Family Medicine

## 2015-12-05 VITALS — BP 104/60 | Ht 66.0 in | Wt 195.0 lb

## 2015-12-05 DIAGNOSIS — M71372 Other bursal cyst, left ankle and foot: Secondary | ICD-10-CM | POA: Diagnosis not present

## 2015-12-05 NOTE — Assessment & Plan Note (Signed)
MRI is not consistent with a tumor but more so with a fifth metatarsal head intraosseous ganglion cyst. -Recommend repeat aspiration injection if becomes bothersome and if recalcitrant would consider surgical removal at that time. Patient amenable to this

## 2015-12-05 NOTE — Progress Notes (Signed)
  Chad Mcfarland - 59 y.o. male MRN [id number]  Date of birth: 1956-02-10 Chad Mcfarland is a 59 y.o. male who presents today for L foot Pain.  L foot pain, f/u - patient presents today for recurrent left lateral column foot pain. There was originally concerned about a stress fracture secondary to increased activity in the beginning of December in which she was seen for this. 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 His pain is greatly improved over the past 2-3 weeks. He has had other ganglions which have been surgically removed. Not adjacent surgery or aspiration at this time. Did have one aspiration/injection done several months ago with minimal success.  PMHx - Updated and reviewed.  Contributory factors include: Noncontributory PSHx - Updated and reviewed.  Contributory factors include:  Ganglion cyst removal FHx - Updated and reviewed.  Contributory factors include:  Noncontributory Medications - updated and reviewed   ROS Per HPI.  12 point negative other than per HPI.   Exam:  Filed Vitals:   12/05/15 0838  BP: 104/60   Gen: NAD, AAO 3 Cardiorespiratory - Normal respiratory effort/rate.  RRR Skin: No rashes or erythema Extremities: No edema, pulses +2 bilateral upper and lower extremity L foot - pes planus with collapse of transverse arch.  Negative Mulder sign.  Neurovascular intact.  Negative hop test and tuning fork.

## 2016-12-03 IMAGING — MR MR FOOT*L* W/O CM
5 of 6 series · 30 of 40 positions shown · non-contrast
Comparison: Radiographs 11/17/2015

CLINICAL DATA: Eight month history of lateral foot pain. Evaluate
for possible stress fracture.

EXAM:
MRI OF THE LEFT FOREFOOT WITHOUT CONTRAST
TECHNIQUE: Multiplanar, multisequence MR imaging was performed. No intravenous
contrast was administered.

[Series 5: T1 · coronal · left · 3.0mm · 0.27mm/px · 3 of 49 slices shown]
[im 1/49]
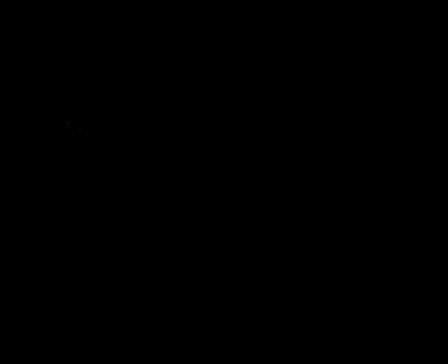
[im 9/49]
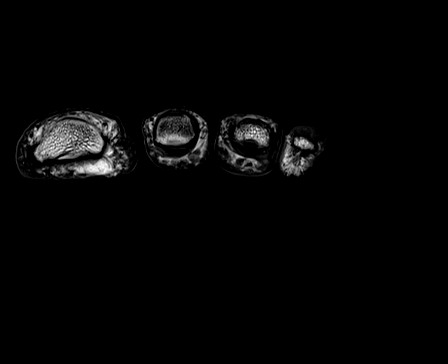
[im 17/49]
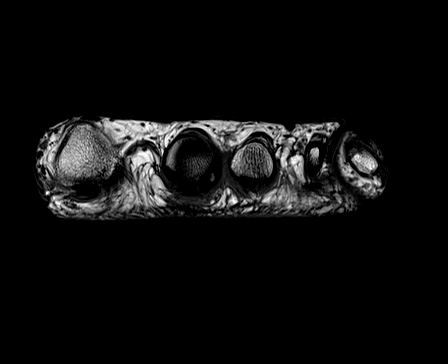

[Series 6: T2 fat-sat · coronal · left · 3.0mm · 0.27mm/px · 8 of 49 slices shown (1 of 4)]
[im 1/49]
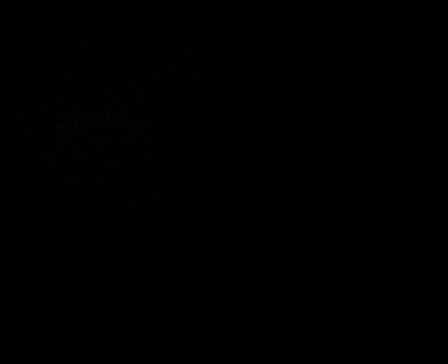
[im 7/49]
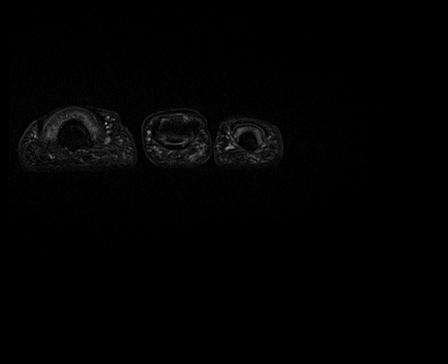
[im 14/49]
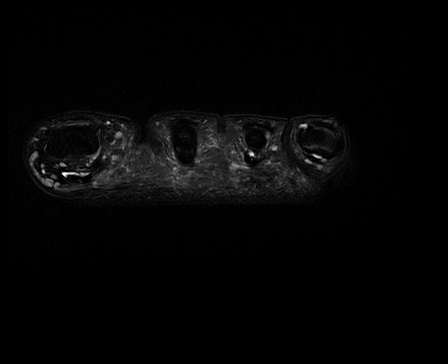
[im 21/49]
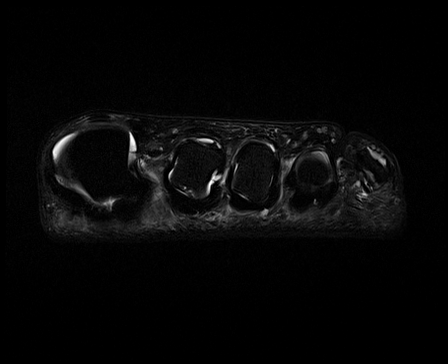
[im 28/49]
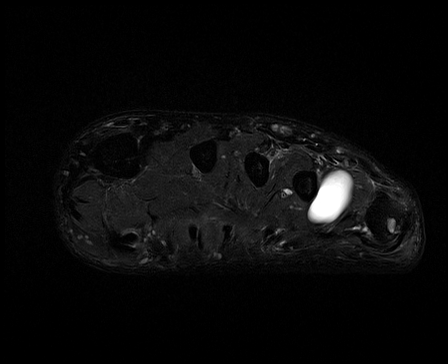
[im 35/49]
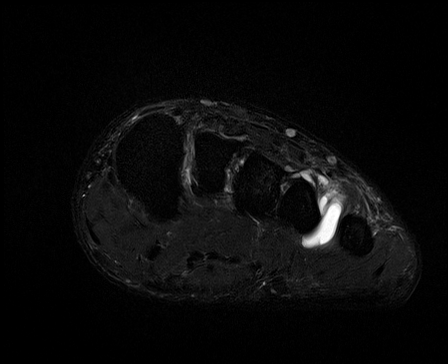
[im 42/49]
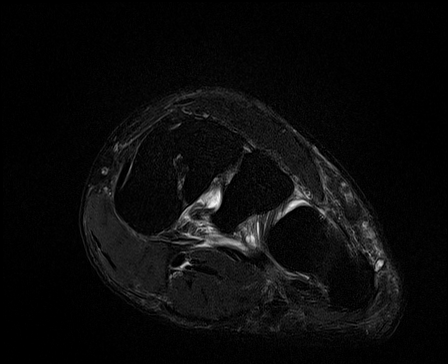
[im 49/49]
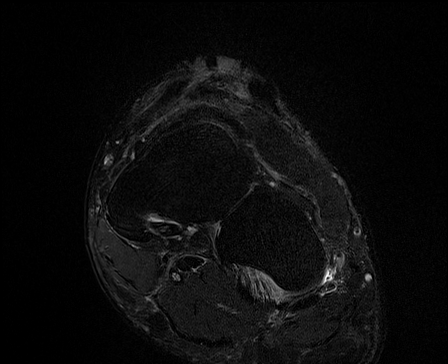

[Series 7: T2 fat-sat · coronal · left · 3.0mm · 0.27mm/px · 8 of 49 slices shown (2 of 4)]
[im 1/49]
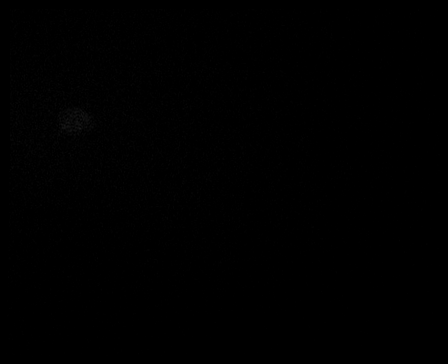
[im 7/49]
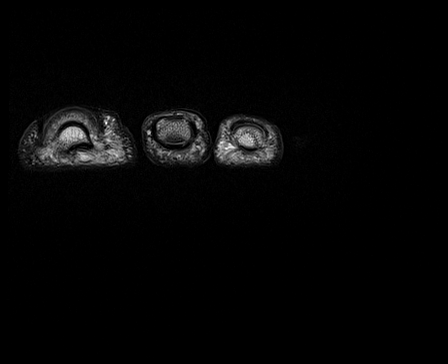
[im 14/49]
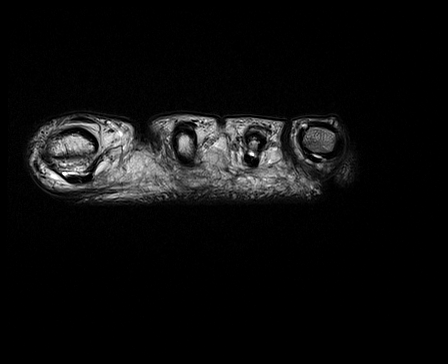
[im 21/49]
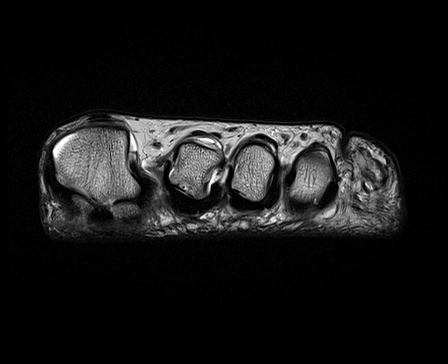
[im 28/49]
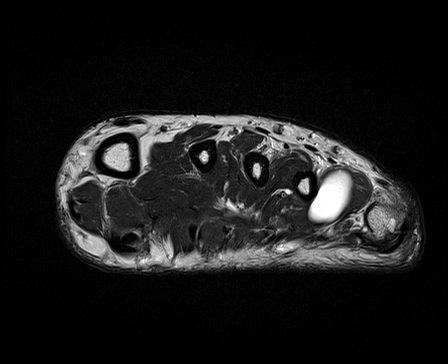
[im 35/49]
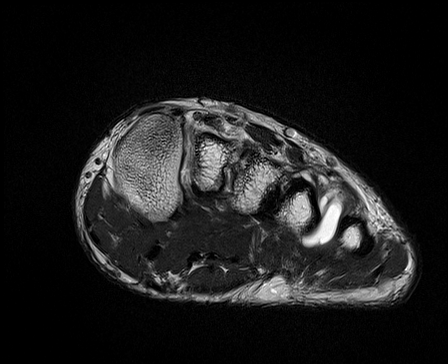
[im 42/49]
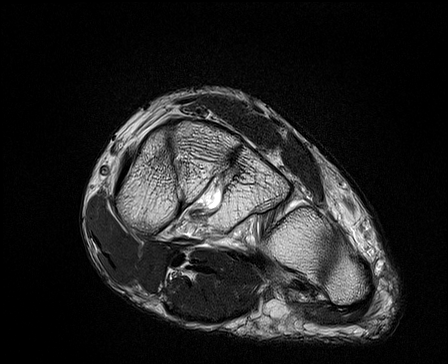
[im 49/49]
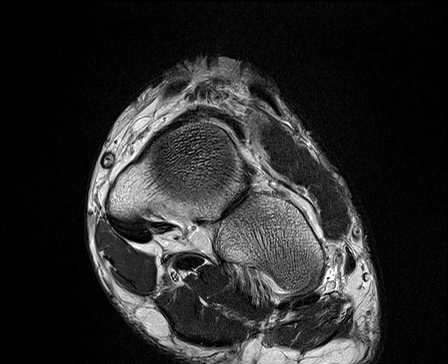

[Series 8: T2 fat-sat · sagittal · left · 3.0mm · 0.42mm/px · 5 of 35 slices shown (3 of 4)]
[im 1/35]
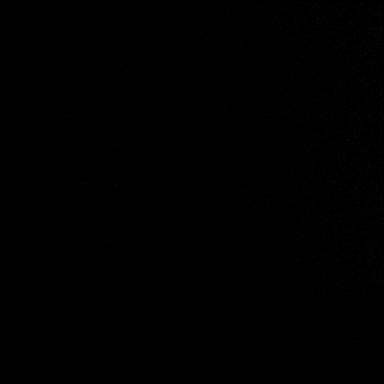
[im 9/35]
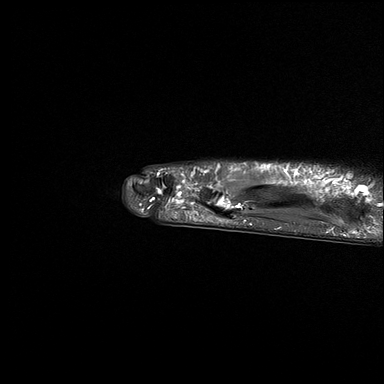
[im 18/35]
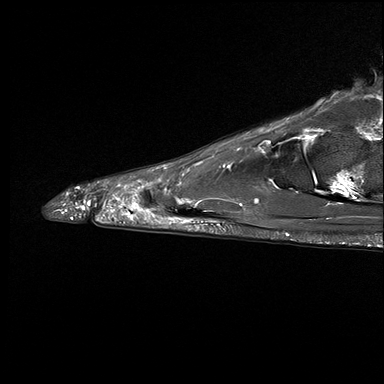
[im 26/35]
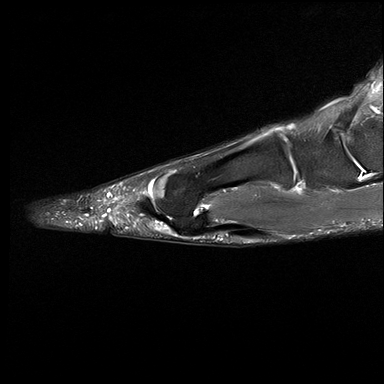
[im 35/35]
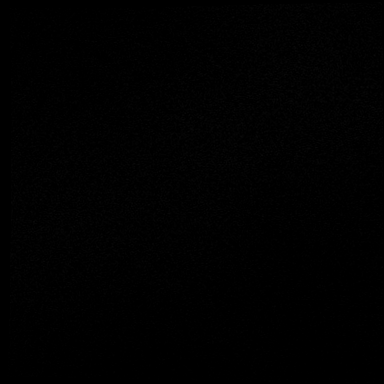

[Series 9: T2 fat-sat · axial · left · 2.5mm · 0.36mm/px · z∈[-77,+25]mm · 6 of 39 slices shown (4 of 4)]
[im 1/39]
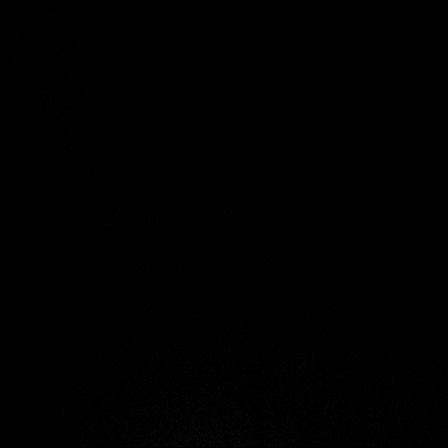
[im 8/39]
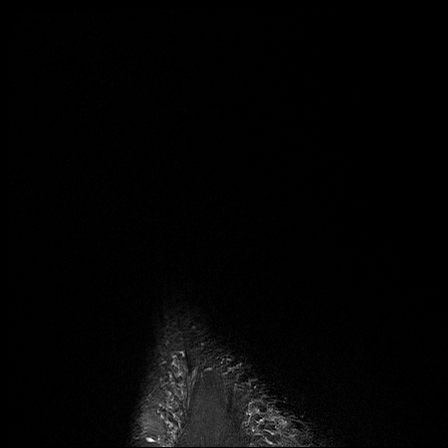
[im 16/39]
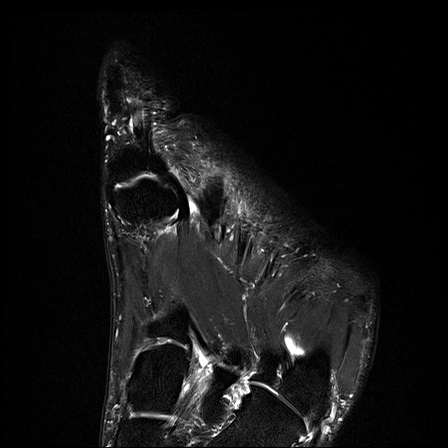
[im 23/39]
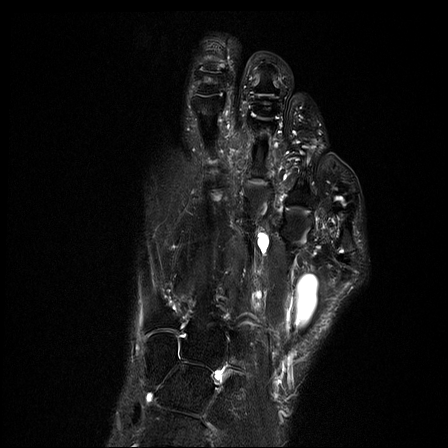
[im 31/39]
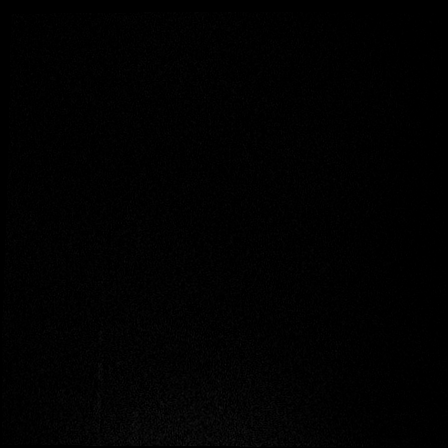
[im 39/39]
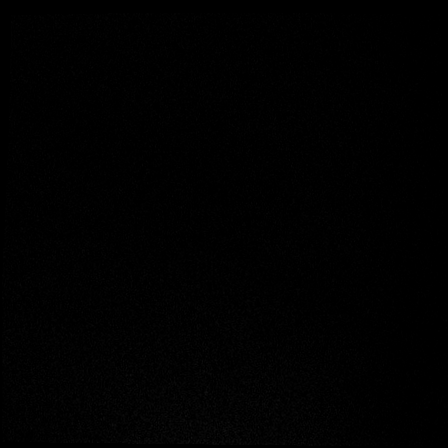

[30 of 40 positions shown; findings below may reference images not displayed]

FINDINGS: No acute bony findings are identified. No stress fracture or
evidence of avascular necrosis.

There are mild degenerative changes involving the DIP and PIP joints
of the toes with small subchondral cysts or possible erosions. The
metatarsal phalangeal joints demonstrate small joint effusions. The
articular cartilage appears intact. No definite erosions. Moderate
degenerative changes at the first metatarsal phalangeal joint. Mild
tenosynovitis involving the flexor tendons of the toes.

There is a small multiseptated cystic structure in the fifth
metatarsal head which is most likely an intraosseous ganglion. There
is a large multi septated cyst between the fourth and fifth
metatarsal shafts which is most likely a ganglion cyst. It may
communicate with smaller cysts between the third and fourth
metatarsals more proximally.

Mild to moderate midfoot degenerative changes with scattered
subchondral cysts and moderate joint effusions. Mild stress edema.
No fracture. Lisfranc ligament is intact.

The foot musculature appears normal. No muscle tear or muscle edema.
IMPRESSION: 1. No stress fracture or AVN.
2. Could not exclude a mild inflammatory arthropathy involving the
foot with joint effusions, subchondral cystic changes and
tenosynovitis. No definite erosions at the metatarsal phalangeal
joints.
3. Probable intraosseous ganglion involving the fifth metatarsal
head.
4. 3.2 x 1.7 x 1.1 cm ganglion cyst located between the fourth and
fifth metatarsals. Other smaller cysts are noted.
5. Midfoot degenerative changes with joint effusions but no
fracture.

## 2019-02-05 ENCOUNTER — Inpatient Hospital Stay (HOSPITAL_COMMUNITY): Payer: 59 | Admitting: Anesthesiology

## 2019-02-05 ENCOUNTER — Encounter (HOSPITAL_COMMUNITY): Payer: Self-pay | Admitting: *Deleted

## 2019-02-05 ENCOUNTER — Encounter (HOSPITAL_COMMUNITY)
Admission: AD | Disposition: A | Discharge status: Home / Self Care | Payer: Self-pay | Source: Home / Self Care | Attending: Urology

## 2019-02-05 ENCOUNTER — Other Ambulatory Visit: Payer: Self-pay | Admitting: Urology

## 2019-02-05 ENCOUNTER — Ambulatory Visit (HOSPITAL_COMMUNITY): Payer: 59

## 2019-02-05 ENCOUNTER — Ambulatory Visit (HOSPITAL_COMMUNITY)
Admission: AD | Admit: 2019-02-05 | Discharge: 2019-02-05 | Disposition: A | Discharge status: Home / Self Care | Payer: 59 | Attending: Urology | Admitting: Urology

## 2019-02-05 DIAGNOSIS — I10 Essential (primary) hypertension: Secondary | ICD-10-CM | POA: Insufficient documentation

## 2019-02-05 DIAGNOSIS — N201 Calculus of ureter: Secondary | ICD-10-CM | POA: Insufficient documentation

## 2019-02-05 DIAGNOSIS — Z7982 Long term (current) use of aspirin: Secondary | ICD-10-CM | POA: Diagnosis not present

## 2019-02-05 DIAGNOSIS — Z87891 Personal history of nicotine dependence: Secondary | ICD-10-CM | POA: Insufficient documentation

## 2019-02-05 DIAGNOSIS — Z87442 Personal history of urinary calculi: Secondary | ICD-10-CM | POA: Diagnosis not present

## 2019-02-05 DIAGNOSIS — Z79899 Other long term (current) drug therapy: Secondary | ICD-10-CM | POA: Insufficient documentation

## 2019-02-05 DIAGNOSIS — G4730 Sleep apnea, unspecified: Secondary | ICD-10-CM | POA: Diagnosis not present

## 2019-02-05 HISTORY — DX: Personal history of urinary calculi: Z87.442

## 2019-02-05 HISTORY — PX: HOLMIUM LASER APPLICATION: SHX5852

## 2019-02-05 HISTORY — DX: Sleep apnea, unspecified: G47.30

## 2019-02-05 HISTORY — PX: CYSTOSCOPY WITH RETROGRADE PYELOGRAM, URETEROSCOPY AND STENT PLACEMENT: SHX5789

## 2019-02-05 SURGERY — CYSTOURETEROSCOPY, WITH RETROGRADE PYELOGRAM AND STENT INSERTION
Anesthesia: General | Site: Ureter | Laterality: Bilateral

## 2019-02-05 MED ORDER — BELLADONNA ALKALOIDS-OPIUM 16.2-60 MG RE SUPP
RECTAL | Status: DC | PRN
  Administered 2019-02-05: 1 via RECTAL

## 2019-02-05 MED ORDER — FENTANYL CITRATE (PF) 100 MCG/2ML IJ SOLN: 25.0000 ug | INTRAMUSCULAR | Status: DC | PRN

## 2019-02-05 MED ORDER — LIDOCAINE 2% (20 MG/ML) 5 ML SYRINGE
INTRAMUSCULAR | Status: AC
  Filled 2019-02-05: qty 5

## 2019-02-05 MED ORDER — FENTANYL CITRATE (PF) 100 MCG/2ML IJ SOLN
INTRAMUSCULAR | Status: AC
  Filled 2019-02-05: qty 2

## 2019-02-05 MED ORDER — MIDAZOLAM HCL 2 MG/2ML IJ SOLN
INTRAMUSCULAR | Status: AC
  Filled 2019-02-05: qty 2

## 2019-02-05 MED ORDER — ONDANSETRON HCL 4 MG/2ML IJ SOLN
INTRAMUSCULAR | Status: AC
  Filled 2019-02-05: qty 2

## 2019-02-05 MED ORDER — LACTATED RINGERS IV SOLN
INTRAVENOUS | Status: DC
  Administered 2019-02-05 (×2): via INTRAVENOUS

## 2019-02-05 MED ORDER — OXYCODONE HCL 5 MG PO TABS: 5.0000 mg | ORAL_TABLET | Freq: Four times a day (QID) | ORAL | 0 refills | Status: AC | PRN

## 2019-02-05 MED ORDER — MIDAZOLAM HCL 2 MG/2ML IJ SOLN
INTRAMUSCULAR | Status: DC | PRN
  Administered 2019-02-05 (×2): 1 mg via INTRAVENOUS

## 2019-02-05 MED ORDER — LIDOCAINE HCL URETHRAL/MUCOSAL 2 % EX GEL
CUTANEOUS | Status: AC
  Filled 2019-02-05: qty 5

## 2019-02-05 MED ORDER — CEFAZOLIN SODIUM-DEXTROSE 2-4 GM/100ML-% IV SOLN
2.0000 g | INTRAVENOUS | Status: AC
  Administered 2019-02-05: 2 g via INTRAVENOUS
  Filled 2019-02-05: qty 100

## 2019-02-05 MED ORDER — SODIUM CHLORIDE 0.9 % IR SOLN
Status: DC | PRN
  Administered 2019-02-05: 1000 mL
  Administered 2019-02-05: 3000 mL via INTRAVESICAL

## 2019-02-05 MED ORDER — DEXAMETHASONE SODIUM PHOSPHATE 4 MG/ML IJ SOLN
INTRAMUSCULAR | Status: DC | PRN
  Administered 2019-02-05: 10 mg via INTRAVENOUS

## 2019-02-05 MED ORDER — PROPOFOL 10 MG/ML IV BOLUS
INTRAVENOUS | Status: AC
  Filled 2019-02-05: qty 20

## 2019-02-05 MED ORDER — DEXAMETHASONE SODIUM PHOSPHATE 10 MG/ML IJ SOLN
INTRAMUSCULAR | Status: AC
  Filled 2019-02-05: qty 1

## 2019-02-05 MED ORDER — FENTANYL CITRATE (PF) 100 MCG/2ML IJ SOLN
INTRAMUSCULAR | Status: DC | PRN
  Administered 2019-02-05 (×4): 50 ug via INTRAVENOUS

## 2019-02-05 MED ORDER — ACETAMINOPHEN 500 MG PO TABS
1000.0000 mg | ORAL_TABLET | Freq: Once | ORAL | Status: AC
  Administered 2019-02-05: 1000 mg via ORAL
  Filled 2019-02-05: qty 2

## 2019-02-05 MED ORDER — LIDOCAINE 2% (20 MG/ML) 5 ML SYRINGE
INTRAMUSCULAR | Status: DC | PRN
  Administered 2019-02-05: 100 mg via INTRAVENOUS

## 2019-02-05 MED ORDER — ONDANSETRON HCL 4 MG PO TABS: 4.0000 mg | ORAL_TABLET | Freq: Three times a day (TID) | ORAL | 1 refills | Status: AC | PRN

## 2019-02-05 MED ORDER — ONDANSETRON HCL 4 MG/2ML IJ SOLN
INTRAMUSCULAR | Status: DC | PRN
  Administered 2019-02-05: 4 mg via INTRAVENOUS

## 2019-02-05 MED ORDER — PROMETHAZINE HCL 25 MG/ML IJ SOLN: 6.2500 mg | INTRAMUSCULAR | Status: DC | PRN

## 2019-02-05 MED ORDER — BELLADONNA ALKALOIDS-OPIUM 16.2-30 MG RE SUPP
RECTAL | Status: AC
  Filled 2019-02-05: qty 1

## 2019-02-05 MED ORDER — SCOPOLAMINE 1 MG/3DAYS TD PT72
1.0000 | MEDICATED_PATCH | TRANSDERMAL | Status: DC
  Administered 2019-02-05: 1.5 mg via TRANSDERMAL
  Filled 2019-02-05: qty 1

## 2019-02-05 MED ORDER — PROPOFOL 10 MG/ML IV BOLUS
INTRAVENOUS | Status: DC | PRN
  Administered 2019-02-05: 180 mg via INTRAVENOUS

## 2019-02-05 SURGICAL SUPPLY — 30 items
BAG URO CATCHER STRL LF (MISCELLANEOUS) ×4 IMPLANT
BALLN NEPHROSTOMY (BALLOONS) ×4
BALLOON NEPHROSTOMY (BALLOONS) ×2 IMPLANT
BASKET STONE 1.7 NGAGE (UROLOGICAL SUPPLIES) ×4 IMPLANT
BASKET STONE NCOMPASS (UROLOGICAL SUPPLIES) IMPLANT
CATH URET 5FR 28IN OPEN ENDED (CATHETERS) ×4 IMPLANT
CATH URET DUAL LUMEN 6-10FR 50 (CATHETERS) ×4 IMPLANT
CLOTH BEACON ORANGE TIMEOUT ST (SAFETY) ×4 IMPLANT
COVER SURGICAL LIGHT HANDLE (MISCELLANEOUS) ×4 IMPLANT
COVER WAND RF STERILE (DRAPES) IMPLANT
EXTRACTOR STONE NITINOL NGAGE (UROLOGICAL SUPPLIES) ×4 IMPLANT
FIBER LASER FLEXIVA 1000 (UROLOGICAL SUPPLIES) IMPLANT
FIBER LASER FLEXIVA 365 (UROLOGICAL SUPPLIES) ×4 IMPLANT
FIBER LASER FLEXIVA 550 (UROLOGICAL SUPPLIES) IMPLANT
FIBER LASER TRAC TIP (UROLOGICAL SUPPLIES) IMPLANT
GLOVE SURG SS PI 8.0 STRL IVOR (GLOVE) ×4 IMPLANT
GOWN STRL REUS W/TWL XL LVL3 (GOWN DISPOSABLE) ×8 IMPLANT
GUIDEWIRE STR DUAL SENSOR (WIRE) ×4 IMPLANT
IV NS 1000ML (IV SOLUTION)
IV NS 1000ML BAXH (IV SOLUTION) IMPLANT
IV NS IRRIG 3000ML ARTHROMATIC (IV SOLUTION) ×8 IMPLANT
KIT BALLN UROMAX 15FX4 (MISCELLANEOUS) ×2 IMPLANT
KIT BALLN UROMAX 26 75X4 (MISCELLANEOUS) ×2
MANIFOLD NEPTUNE II (INSTRUMENTS) ×4 IMPLANT
NS IRRIG 1000ML POUR BTL (IV SOLUTION) ×4 IMPLANT
PACK CYSTO (CUSTOM PROCEDURE TRAY) ×4 IMPLANT
SHEATH URETERAL 12FRX35CM (MISCELLANEOUS) ×4 IMPLANT
TUBING CONNECTING 10 (TUBING) ×3 IMPLANT
TUBING CONNECTING 10' (TUBING) ×1
TUBING UROLOGY SET (TUBING) ×4 IMPLANT

## 2019-02-05 NOTE — Transfer of Care (Signed)
Immediate Anesthesia Transfer of Care Note  Patient: Chad Mcfarland  Procedure(s) Performed: CYSTOSCOPY WITH BILATERAL RETROGRADE PYELOGRAM, RIGHT URETEROSCOPY WITH LASER AND STENT PLACEMENT/LEFT URETEROSCOPY WITH STONE BASKETING (Bilateral Ureter) HOLMIUM LASER APPLICATION (Bilateral )  Patient Location: PACU  Anesthesia Type:General  Level of Consciousness: awake, alert  and oriented  Airway & Oxygen Therapy: Patient Spontanous Breathing and Patient connected to face mask oxygen  Post-op Assessment: Report given to RN and Post -op Vital signs reviewed and stable  Post vital signs: Reviewed and stable  Last Vitals:  Vitals Value Taken Time  BP    Temp    Pulse    Resp 14 02/05/2019  7:18 PM  SpO2    Vitals shown include unvalidated device data.  Last Pain:  Vitals:   02/05/19 1731  TempSrc:   PainSc: 2          Complications: No apparent anesthesia complications

## 2019-02-05 NOTE — H&P (Signed)
Sign when Signing Visit         Show:Clear all [] Manual[] Template[x] Copied  Added by: [x] Bjorn Pippin, MD  [] Hover for details CC: I have kidney stones.  HPI: Chad Mcfarland is a 63 year-old male patient who is here for renal calculi.  The problem is on the right side.   Mr. Packard had the onset at 3am of gross hematuria. He had the onset of severe right flank pain with N/V about 5 am. He had a similar episode that was milder about a week ago. He has had some frequency and urgency today but no dysuria. He has no fever. He has a history of stones with prior ESWL and was last seen in 2012. He had cold sweats this morning.      ALLERGIES: Codeine Derivatives Doxycycline Tetracycline HCl CAPS    MEDICATIONS: Aspirin 325 MG Oral Tablet Oral  CoQ10 CAPS Oral  EPINEPHrine 0.3 MG/0.3ML DEVI 0 Injection  Fish Oil CAPS Oral  Glucosamine Chond Complex/MSM Oral Tablet Oral  Multi-Vitamin TABS Oral     GU PSH: ESWL - 2009      PSH Notes: Lithotripsy   NON-GU PSH: None   GU PMH: Dorsalgia, Unspec, Backache - 2014 Dysuria, Dysuria - 2014      PMH Notes:  2008-07-05 10:22:31 - Note: Normal Routine History And Physical Adult  2011-12-07 13:29:03 - Note: Urinary Calculus On The Left   NON-GU PMH: Hypertension Sleep Apnea    FAMILY HISTORY: copd - Runs in Family Family Health Status Number - Runs In Family nephrolithiasis - Father, Brother   SOCIAL HISTORY: Marital Status: Married Preferred Language: English; Race: White Current Smoking Status: Patient does not smoke anymore.   Tobacco Use Assessment Completed: Used Tobacco in last 30 days? Drinks 4 drinks per day.     REVIEW OF SYSTEMS:    GU Review Male:   Patient reports burning/ pain with urination. Patient denies frequent urination, hard to postpone urination, get up at night to urinate, leakage of urine, stream starts and stops, trouble starting your stream, have to strain to urinate , erection  problems, and penile pain.  Gastrointestinal (Upper):   Patient reports nausea and vomiting. Patient denies indigestion/ heartburn.  Gastrointestinal (Lower):   Patient denies diarrhea and constipation.  Constitutional:   Patient denies night sweats, fatigue, weight loss, and fever.  Skin:   Patient reports skin rash/ lesion and itching.   Eyes:   Patient denies blurred vision and double vision.  Ears/ Nose/ Throat:   Patient denies sore throat and sinus problems.  Hematologic/Lymphatic:   Patient denies swollen glands and easy bruising.  Cardiovascular:   Patient denies leg swelling and chest pains.  Respiratory:   Patient denies cough and shortness of breath.  Endocrine:   Patient denies excessive thirst.  Musculoskeletal:   Patient reports back pain and joint pain.   Neurological:   Patient denies headaches and dizziness.  Psychologic:   Patient denies depression and anxiety.   VITAL SIGNS:      02/05/2019 03:02 PM  Weight 212 lb / 96.16 kg  Height 66 in / 167.64 cm  BP 143/89 mmHg  Pulse 89 /min  Temperature 97.5 F / 36.3 C  BMI 34.2 kg/m   GU PHYSICAL EXAMINATION:    Anus and Perineum: No hemorrhoids. No anal stenosis. No rectal fissure, no anal fissure. No edema, no dimple, no perineal tenderness, no anal tenderness.  Scrotum: No lesions. No edema. No cysts. No warts.  Epididymides: Right: no spermatocele,  no masses, no cysts, no tenderness, no induration, no enlargement. Left: no spermatocele, no masses, no cysts, no tenderness, no induration, no enlargement.  Testes: No tenderness, no swelling, no enlargement left testes. No tenderness, no swelling, no enlargement right testes. Normal location left testes. Normal location right testes. No mass, no cyst, no varicocele, no hydrocele left testes. No mass, no cyst, no varicocele, no hydrocele right testes.  Urethral Meatus: Normal size. No lesion, no wart, no discharge, no polyp. Normal location.  Penis: Circumcised, no warts, no  cracks. No dorsal Peyronie's plaques, no left corporal Peyronie's plaques, no right corporal Peyronie's plaques, no scarring, no warts. No balanitis, no meatal stenosis.  Prostate: 40 gram or 2+ size. Left lobe normal consistency, right lobe normal consistency. Symmetrical lobes. No prostate nodule. Left lobe no tenderness, right lobe no tenderness.  Seminal Vesicles: Nonpalpable.   MULTI-SYSTEM PHYSICAL EXAMINATION:    Constitutional: Obese. No physical deformities. Normally developed. Good grooming.   Neck: Neck symmetrical, not swollen. Normal tracheal position.  Respiratory: Normal breath sounds. No labored breathing, no use of accessory muscles.   Cardiovascular: Regular rate and rhythm. No murmur, no gallop.   Lymphatic: No enlargement, no tenderness of supraclavicular, neck lymph nodes.  Skin: No paleness, no jaundice, no cyanosis. No lesion, no ulcer, no rash.  Neurologic / Psychiatric: Oriented to time, oriented to place, oriented to person. No depression, no anxiety, no agitation.  Gastrointestinal: Abdominal tenderness, mild RCVA, obese. No mass, no rigidity.   Musculoskeletal: Normal gait and station of head and neck.     PAST DATA REVIEWED:  Source Of History:  Patient  Records Review:   Previous Patient Records  Urine Test Review:   Urinalysis  X-Ray Review: C.T. Stone Protocol: Reviewed Films. Discussed With Patient.    Notes:                     I have reviewed his prior office notes.    PROCEDURES:         C.T. Urogram - [id number]  He has a 5x157mm right distal stone and a 3x74mm left distal stone. He has no renal stones. A full report is pending.       Patient confirmed No Neulasta OnPro Device.            Urinalysis w/Scope Dipstick Dipstick Cont'd Micro  Color: Brown Bilirubin: Neg mg/dL WBC/hpf: 6 - 16/XWR10/hpf  Appearance: Cloudy Ketones: Neg mg/dL RBC/hpf: >60/AVW>60/hpf  Specific Gravity: 1.020 Blood: 3+ ery/uL Bacteria: Rare (0-9/hpf)  pH: 7.5 Protein: 1+ mg/dL  Cystals: NS (Not Seen)  Glucose: Neg mg/dL Urobilinogen: 0.2 mg/dL Casts: NS (Not Seen)    Nitrites: Neg Trichomonas: Not Present    Leukocyte Esterase: Trace leu/uL Mucous: Present      Epithelial Cells: NS (Not Seen)      Yeast: NS (Not Seen)      Sperm: Not Present    ASSESSMENT:      ICD-10 Details  1 GU:   Gross hematuria - R31.0   2   Flank Pain - R10.84   3   Ureteral calculus - N20.1 He has bilateral distal ureteral stones with right obstruction and pain. I discussed the options and he would like to proceed with bilateral URS today. I have reviewed the risks of ureteroscopy including bleeding, infection, ureteral injury, need for a stent or secondary procedures, thrombotic events and anesthetic complications.    PLAN:           Orders  X-Rays: C.T. Stone Protocol Without Contrast  X-Ray Notes: History:  Hematuria: Yes/No  Patient to see MD after exam: Yes/No  Previous exam: CT / IVP/ US/ KUB/ None  When:  Where:  Diabetic: Yes/ No  BUN/ Creatinine:  Date of last BUN Creatinine:  Weight in pounds:  Allergy- IV Contrast: Yes/ No  Conflicting diabetic meds: Yes/ No  Diabetic Meds:  Prior Authorization #: [id number] valid from 2/17 thru 03/22/19           Schedule Return Visit/Planned Activity: ASAP - Schedule Surgery          Document Letter(s):  Created for Patient: Clinical Summary         Notes:   CC: Dr. Virl Son.         Next Appointment:      Next Appointment: 02/13/2019 02:45 PM    Appointment Type: Postoperative Appointment    Location: [facility] [phone]    Provider: Anne Fu    Reason for Visit: POST OP

## 2019-02-05 NOTE — Progress Notes (Unsigned)
CC: I have kidney stones.  HPI: Chad Mcfarland is a 63 year-old male patient who is here for renal calculi.  The problem is on the right side.   Chad Mcfarland had the onset at 3am of gross hematuria. He had the onset of severe right flank pain with N/V about 5 am. He had a similar episode that was milder about a week ago. He has had some frequency and urgency today but no dysuria. He has no fever. He has a history of stones with prior ESWL and was last seen in 2012. He had cold sweats this morning.      ALLERGIES: Codeine Derivatives Doxycycline Tetracycline HCl CAPS    MEDICATIONS: Aspirin 325 MG Oral Tablet Oral  CoQ10 CAPS Oral  EPINEPHrine 0.3 MG/0.3ML DEVI 0 Injection  Fish Oil CAPS Oral  Glucosamine Chond Complex/MSM Oral Tablet Oral  Multi-Vitamin TABS Oral     GU PSH: ESWL - 2009      PSH Notes: Lithotripsy   NON-GU PSH: None   GU PMH: Dorsalgia, Unspec, Backache - 2014 Dysuria, Dysuria - 2014      PMH Notes:  2008-07-05 10:22:31 - Note: Normal Routine History And Physical Adult  2011-12-07 13:29:03 - Note: Urinary Calculus On The Left   NON-GU PMH: Hypertension Sleep Apnea    FAMILY HISTORY: copd - Runs in Family Family Health Status Number - Runs In Family nephrolithiasis - Father, Brother   SOCIAL HISTORY: Marital Status: Married Preferred Language: English; Race: White Current Smoking Status: Patient does not smoke anymore.   Tobacco Use Assessment Completed: Used Tobacco in last 30 days? Drinks 4 drinks per day.     REVIEW OF SYSTEMS:    GU Review Male:   Patient reports burning/ pain with urination. Patient denies frequent urination, hard to postpone urination, get up at night to urinate, leakage of urine, stream starts and stops, trouble starting your stream, have to strain to urinate , erection problems, and penile pain.  Gastrointestinal (Upper):   Patient reports nausea and vomiting. Patient denies indigestion/ heartburn.  Gastrointestinal  (Lower):   Patient denies diarrhea and constipation.  Constitutional:   Patient denies night sweats, fatigue, weight loss, and fever.  Skin:   Patient reports skin rash/ lesion and itching.   Eyes:   Patient denies blurred vision and double vision.  Ears/ Nose/ Throat:   Patient denies sore throat and sinus problems.  Hematologic/Lymphatic:   Patient denies swollen glands and easy bruising.  Cardiovascular:   Patient denies leg swelling and chest pains.  Respiratory:   Patient denies cough and shortness of breath.  Endocrine:   Patient denies excessive thirst.  Musculoskeletal:   Patient reports back pain and joint pain.   Neurological:   Patient denies headaches and dizziness.  Psychologic:   Patient denies depression and anxiety.   VITAL SIGNS:      02/05/2019 03:02 PM  Weight 212 lb / 96.16 kg  Height 66 in / 167.64 cm  BP 143/89 mmHg  Pulse 89 /min  Temperature 97.5 F / 36.3 C  BMI 34.2 kg/m   GU PHYSICAL EXAMINATION:    Anus and Perineum: No hemorrhoids. No anal stenosis. No rectal fissure, no anal fissure. No edema, no dimple, no perineal tenderness, no anal tenderness.  Scrotum: No lesions. No edema. No cysts. No warts.  Epididymides: Right: no spermatocele, no masses, no cysts, no tenderness, no induration, no enlargement. Left: no spermatocele, no masses, no cysts, no tenderness, no induration, no enlargement.  Testes:  No tenderness, no swelling, no enlargement left testes. No tenderness, no swelling, no enlargement right testes. Normal location left testes. Normal location right testes. No mass, no cyst, no varicocele, no hydrocele left testes. No mass, no cyst, no varicocele, no hydrocele right testes.  Urethral Meatus: Normal size. No lesion, no wart, no discharge, no polyp. Normal location.  Penis: Circumcised, no warts, no cracks. No dorsal Peyronie's plaques, no left corporal Peyronie's plaques, no right corporal Peyronie's plaques, no scarring, no warts. No balanitis, no  meatal stenosis.  Prostate: 40 gram or 2+ size. Left lobe normal consistency, right lobe normal consistency. Symmetrical lobes. No prostate nodule. Left lobe no tenderness, right lobe no tenderness.  Seminal Vesicles: Nonpalpable.   MULTI-SYSTEM PHYSICAL EXAMINATION:    Constitutional: Obese. No physical deformities. Normally developed. Good grooming.   Neck: Neck symmetrical, not swollen. Normal tracheal position.  Respiratory: Normal breath sounds. No labored breathing, no use of accessory muscles.   Cardiovascular: Regular rate and rhythm. No murmur, no gallop.   Lymphatic: No enlargement, no tenderness of supraclavicular, neck lymph nodes.  Skin: No paleness, no jaundice, no cyanosis. No lesion, no ulcer, no rash.  Neurologic / Psychiatric: Oriented to time, oriented to place, oriented to person. No depression, no anxiety, no agitation.  Gastrointestinal: Abdominal tenderness, mild RCVA, obese. No mass, no rigidity.   Musculoskeletal: Normal gait and station of head and neck.     PAST DATA REVIEWED:  Source Of History:  Patient  Records Review:   Previous Patient Records  Urine Test Review:   Urinalysis  X-Ray Review: C.T. Stone Protocol: Reviewed Films. Discussed With Patient.    Notes:                     I have reviewed his prior office notes.    PROCEDURES:         C.T. Urogram - [id number]  He has a 5x38mm right distal stone and a 3x28mm left distal stone. He has no renal stones. A full report is pending.       Patient confirmed No Neulasta OnPro Device.            Urinalysis w/Scope Dipstick Dipstick Cont'd Micro  Color: Brown Bilirubin: Neg mg/dL WBC/hpf: 6 - 09/BDZ  Appearance: Cloudy Ketones: Neg mg/dL RBC/hpf: >32/DJM  Specific Gravity: 1.020 Blood: 3+ ery/uL Bacteria: Rare (0-9/hpf)  pH: 7.5 Protein: 1+ mg/dL Cystals: NS (Not Seen)  Glucose: Neg mg/dL Urobilinogen: 0.2 mg/dL Casts: NS (Not Seen)    Nitrites: Neg Trichomonas: Not Present    Leukocyte Esterase: Trace  leu/uL Mucous: Present      Epithelial Cells: NS (Not Seen)      Yeast: NS (Not Seen)      Sperm: Not Present    ASSESSMENT:      ICD-10 Details  1 GU:   Gross hematuria - R31.0   2   Flank Pain - R10.84   3   Ureteral calculus - N20.1 He has bilateral distal ureteral stones with right obstruction and pain. I discussed the options and he would like to proceed with bilateral URS today. I have reviewed the risks of ureteroscopy including bleeding, infection, ureteral injury, need for a stent or secondary procedures, thrombotic events and anesthetic complications.    PLAN:           Orders X-Rays: C.T. Stone Protocol Without Contrast  X-Ray Notes: History:  Hematuria: Yes/No  Patient to see MD after exam: Yes/No  Previous exam: CT /  IVP/ US/ KUB/ None  When:  Where:  Diabetic: Yes/ No  BUN/ Creatinine:  Date of last BUN Creatinine:  Weight in pounds:  Allergy- IV Contrast: Yes/ No  Conflicting diabetic meds: Yes/ No  Diabetic Meds:  Prior Authorization #: [id number] valid from 2/17 thru 03/22/19           Schedule Return Visit/Planned Activity: ASAP - Schedule Surgery          Document Letter(s):  Created for Patient: Clinical Summary         Notes:   CC: Dr. Virl Son.         Next Appointment:      Next Appointment: 02/13/2019 02:45 PM    Appointment Type: Postoperative Appointment    Location: [facility] [phone]    Provider: Anne Fu    Reason for Visit: POST OP

## 2019-02-05 NOTE — Anesthesia Postprocedure Evaluation (Signed)
Anesthesia Post Note  Patient: KINDELL KREITLER  Procedure(s) Performed: CYSTOSCOPY WITH BILATERAL RETROGRADE PYELOGRAM, RIGHT URETEROSCOPY WITH LASER AND STENT PLACEMENT/LEFT URETEROSCOPY WITH STONE BASKETING (Bilateral Ureter) HOLMIUM LASER APPLICATION (Bilateral )     Patient location during evaluation: PACU Anesthesia Type: General Level of consciousness: sedated Pain management: pain level controlled Vital Signs Assessment: post-procedure vital signs reviewed and stable Respiratory status: spontaneous breathing and respiratory function stable Cardiovascular status: stable Postop Assessment: no apparent nausea or vomiting Anesthetic complications: no    Last Vitals:  Vitals:   02/05/19 1930 02/05/19 1945  BP: (!) 153/90 (!) 140/92  Pulse: 77 69  Resp: 17 17  Temp:  (!) 36.4 C  SpO2: 97% 96%    Last Pain:  Vitals:   02/05/19 1945  TempSrc:   PainSc: 0-No pain                 Janeen Watson DANIEL

## 2019-02-05 NOTE — Discharge Instructions (Addendum)
Ureteral Stent Implantation, Care After Refer to this sheet in the next few weeks. These instructions provide you with information about caring for yourself after your procedure. Your health care provider may also give you more specific instructions. Your treatment has been planned according to current medical practices, but problems sometimes occur. Call your health care provider if you have any problems or questions after your procedure. What can I expect after the procedure? After the procedure, it is common to have:  Nausea.  Mild pain when you urinate. You may feel this pain in your lower back or lower abdomen. Pain should stop within a few minutes after you urinate. This may last for up to 1 week.  A small amount of blood in your urine for several days. Follow these instructions at home:  Medicines  Take over-the-counter and prescription medicines only as told by your health care provider.  If you were prescribed an antibiotic medicine, take it as told by your health care provider. Do not stop taking the antibiotic even if you start to feel better.  Do not drive for 24 hours if you received a sedative.  Do not drive or operate heavy machinery while taking prescription pain medicines. Activity  Return to your normal activities as told by your health care provider. Ask your health care provider what activities are safe for you.  Do not lift anything that is heavier than 10 lb (4.5 kg). Follow this limit for 1 week after your procedure, or for as long as told by your health care provider. General instructions  Watch for any blood in your urine. Call your health care provider if the amount of blood in your urine increases.  If you have a catheter: ? Follow instructions from your health care provider about taking care of your catheter and collection bag. ? Do not take baths, swim, or use a hot tub until your health care provider approves.  Drink enough fluid to keep your urine  clear or pale yellow.  Keep all follow-up visits as told by your health care provider. This is important. Contact a health care provider if:  You have pain that gets worse or does not get better with medicine, especially pain when you urinate.  You have difficulty urinating.  You feel nauseous or you vomit repeatedly during a period of more than 2 days after the procedure. Get help right away if:  Your urine is dark red or has blood clots in it.  You are leaking urine (have incontinence).  The end of the stent comes out of your urethra.  You cannot urinate.  You have sudden, sharp, or severe pain in your abdomen or lower back.  You have a fever.  You may remove the stent by pulling the attached string on Thursday morning.     Please bring the stone fragments to the office at follow up for analysis.    This information is not intended to replace advice given to you by your health care provider. Make sure you discuss any questions you have with your health care provider. Document Released: 08/08/2013 Document Revised: 05/13/2016 Document Reviewed: 06/20/2015 Elsevier Interactive Patient Education  2019 [employer]   General Anesthesia, Adult, Care After This sheet gives you information about how to care for yourself after your procedure. Your health care provider may also give you more specific instructions. If you have problems or questions, contact your health care provider. What can I expect after the procedure? After the procedure, the following  side effects are common:  Pain or discomfort at the IV site.  Nausea.  Vomiting.  Sore throat.  Trouble concentrating.  Feeling cold or chills.  Weak or tired.  Sleepiness and fatigue.  Soreness and body aches. These side effects can affect parts of the body that were not involved in surgery. Follow these instructions at home:  For at least 24 hours after the procedure:  Have a responsible adult stay with you.  It is important to have someone help care for you until you are awake and alert.  Rest as needed.  Do not: ? Participate in activities in which you could fall or become injured. ? Drive. ? Use heavy machinery. ? Drink alcohol. ? Take sleeping pills or medicines that cause drowsiness. ? Make important decisions or sign legal documents. ? Take care of children on your own. Eating and drinking  Follow any instructions from your health care provider about eating or drinking restrictions.  When you feel hungry, start by eating small amounts of foods that are soft and easy to digest (bland), such as toast. Gradually return to your regular diet.  Drink enough fluid to keep your urine pale yellow.  If you vomit, rehydrate by drinking water, juice, or clear broth. General instructions  If you have sleep apnea, surgery and certain medicines can increase your risk for breathing problems. Follow instructions from your health care provider about wearing your sleep device: ? Anytime you are sleeping, including during daytime naps. ? While taking prescription pain medicines, sleeping medicines, or medicines that make you drowsy.  Return to your normal activities as told by your health care provider. Ask your health care provider what activities are safe for you.  Take over-the-counter and prescription medicines only as told by your health care provider.  If you smoke, do not smoke without supervision.  Keep all follow-up visits as told by your health care provider. This is important. Contact a health care provider if:  You have nausea or vomiting that does not get better with medicine.  You cannot eat or drink without vomiting.  You have pain that does not get better with medicine.  You are unable to pass urine.  You develop a skin rash.  You have a fever.  You have redness around your IV site that gets worse. Get help right away if:  You have difficulty breathing.  You have  chest pain.  You have blood in your urine or stool, or you vomit blood. Summary  After the procedure, it is common to have a sore throat or nausea. It is also common to feel tired.  Have a responsible adult stay with you for the first 24 hours after general anesthesia. It is important to have someone help care for you until you are awake and alert.  When you feel hungry, start by eating small amounts of foods that are soft and easy to digest (bland), such as toast. Gradually return to your regular diet.  Drink enough fluid to keep your urine pale yellow.  Return to your normal activities as told by your health care provider. Ask your health care provider what activities are safe for you. This information is not intended to replace advice given to you by your health care provider. Make sure you discuss any questions you have with your health care provider. Document Released: 03/14/2001 Document Revised: 07/22/2017 Document Reviewed: 07/22/2017 Elsevier Interactive Patient Education  2019 [employer].

## 2019-02-05 NOTE — Anesthesia Preprocedure Evaluation (Addendum)
Anesthesia Evaluation  Patient identified by MRN, date of birth, ID band Patient awake    Reviewed: Allergy & Precautions, NPO status , Patient's Chart, lab work & pertinent test results  History of Anesthesia Complications Negative for: history of anesthetic complications  Airway Mallampati: II  TM Distance: >3 FB Neck ROM: Full    Dental no notable dental hx. (+) Dental Advisory Given   Pulmonary neg pulmonary ROS,    Pulmonary exam normal        Cardiovascular negative cardio ROS Normal cardiovascular exam     Neuro/Psych negative neurological ROS     GI/Hepatic negative GI ROS, Neg liver ROS,   Endo/Other  negative endocrine ROS  Renal/GU      Musculoskeletal negative musculoskeletal ROS (+)   Abdominal   Peds  Hematology negative hematology ROS (+)   Anesthesia Other Findings Day of surgery medications reviewed with the patient.  Reproductive/Obstetrics                            Anesthesia Physical Anesthesia Plan  ASA: II  Anesthesia Plan: General   Post-op Pain Management:    Induction: Intravenous  PONV Risk Score and Plan: 3 and Ondansetron, Dexamethasone and Scopolamine patch - Pre-op  Airway Management Planned: LMA  Additional Equipment:   Intra-op Plan:   Post-operative Plan: Extubation in OR  Informed Consent: I have reviewed the patients History and Physical, chart, labs and discussed the procedure including the risks, benefits and alternatives for the proposed anesthesia with the patient or authorized representative who has indicated his/her understanding and acceptance.     Dental advisory given  Plan Discussed with: CRNA and Anesthesiologist  Anesthesia Plan Comments:        Anesthesia Quick Evaluation

## 2019-02-05 NOTE — Anesthesia Procedure Notes (Signed)
Procedure Name: LMA Insertion Date/Time: 02/05/2019 6:20 PM Performed by: Minerva Ends, CRNA Pre-anesthesia Checklist: Patient identified, Emergency Drugs available, Suction available and Patient being monitored Patient Re-evaluated:Patient Re-evaluated prior to induction Oxygen Delivery Method: Circle System Utilized Preoxygenation: Pre-oxygenation with 100% oxygen Induction Type: IV induction Ventilation: Mask ventilation without difficulty LMA: LMA with gastric port inserted LMA Size: 4.0 Number of attempts: 1 Placement Confirmation: positive ETCO2 Tube secured with: Tape Dental Injury: Teeth and Oropharynx as per pre-operative assessment  Comments: Smooth IV induction Krista Blue- LMA AM CRNA atraumatic-- teeth and mouth as preop bilat BS

## 2019-02-05 NOTE — Op Note (Signed)
Procedure: 1.  Cystoscopy with bilateral retrograde pyelograms and interpretation. 2.  Right ureteroscopic stone extraction with holmium laser application and insertion of right double-J stent. 3.  Left ureteroscopic stone extraction.  Preop diagnosis: Bilateral distal ureteral stones.  Postop diagnosis: Same.  Surgeon: Dr. Irine Seal.  Anesthesia: General.  Specimens: Stone fragments.  Drains: 6 French by 26 cm right contour double-J stent with tether.  EBL: Minimal.  Complications: None.  Indications: Mr. Chad Mcfarland is a 63 year old male with a history of urolithiasis who presents today with a history of intermittent gross hematuria and right flank pain.  A CT scan today demonstrated a 5 x 7 mm right distal ureteral stone with some obstruction and a 3 x 4 mm left distal ureteral stone.  After review of the options, it was felt that bilateral ureteroscopy was indicated.  Procedure: He was given 2 g of Ancef.  He was taken the operating room where general anesthetic was induced.  He was placed in lithotomy position and fitted with PAS hose.  His perineum and genitalia were prepped with Betadine solution he was draped in usual sterile fashion.  Cystoscopy was performed using the 23 [place] scope and 30 degree lens.  Examination revealed a normal urethra.  The external sphincter was intact.  The prostatic urethra short without obstruction.  The bladder wall was smooth and pale without tumors, stones or inflammation.  Ureteral orifice ease were unremarkable.  The right ureteral orifice was cannulated with a 5 French opening catheter and contrast was instilled.  The right retrograde pyelogram revealed a normal caliber distal ureter with a filling defect consistent with a stone approximately 3 to 4 cm above the meatus.  There was mild proximal dilation.  A guidewire was passed to the kidney and the open-ended catheter was removed.  A 4 cm x 15 French balloon dilation cath was passed over the wire  to the level of the stone and the balloon was inflated to 20 atm.  No residual waist was identified.  The balloon was removed along with the cystoscope.  A 6.5 French semirigid ureteroscope was passed alongside the wire and the stone was visualized.  An initial attempt to remove the stone intact with the engage basket was not successful.  The stone was then fragmented using the 365 m holmium laser fiber set on 0.5 J and 10 Hz.  This fragments were then relocated to the bladder.  The cystoscope was then reinserted into the bladder and the fragments were removed.  The 5 French opening catheter was then used to perform a left retrograde pyelogram.  The left retrograde pyelogram revealed a small filling defect approximately 1 to 2 cm proximal to the meatus with no significant proximal dilation.  A sensor guidewire was then advanced to the left kidney and the cystoscope was removed6-1/2 [place] semirigid ureteroscope was then successfully advanced alongside the wire into the ureter and the stone was visualized.  The stone was grasped with an engage basket.  And removed without difficulty.  The wire was then removed as well as it was felt the stent was not indicated on the left.  The cystoscope was then reinserted over the wire to the right kidney and a 6 [place] by 26 cm contour double-J stent with tether was passed to the kidney under fluoroscopic guidance.  The wire was removed leaving a good curl in the kidney and a good coil in the bladder.  The bladder was then drained and the cystoscope was removed leaving the  stent string exiting the urethra.  The string was secured to the patient's penis after the drapes were removed.  He was taken down from lithotomy position, his anesthetic was reversed and he was moved recovery in stable condition.  There were no complications.

## 2019-02-11 NOTE — Discharge Summary (Signed)
Physician Discharge Summary  Patient ID: Chad Mcfarland MRN: [id number] DOB/AGE: Dec 10, 1956 63 y.o.  Admit date: 02/05/2019 Discharge date: 02/11/2019  Admission Diagnoses:  Right ureteral stone  Discharge Diagnoses:  Principal Problem:   Right ureteral stone Active Problems:   Left ureteral stone   Past Medical History:  Diagnosis Date  . History of kidney stones   . Kidney stone   . Sleep apnea    does not use cpap    Surgeries: Procedure(s): CYSTOSCOPY WITH BILATERAL RETROGRADE PYELOGRAM, RIGHT URETEROSCOPY WITH LASER AND STENT PLACEMENT/LEFT URETEROSCOPY WITH STONE BASKETING HOLMIUM LASER APPLICATION on 7/93/9030   Consultants (if any):   Discharged Condition: Improved  Hospital Course: Chad Mcfarland is an 63 y.o. male who was admitted 02/05/2019 with a diagnosis of Right ureteral stone and went to the operating room on 02/05/2019 and underwent the above named procedures. He tolerated the procedure well and was discharged post op.   He was given perioperative antibiotics:  Anti-infectives (From admission, onward)   Start     Dose/Rate Route Frequency Ordered Stop   02/05/19 1658  ceFAZolin (ANCEF) IVPB 2g/100 mL premix     2 g 200 mL/hr over 30 Minutes Intravenous 30 min pre-op 02/05/19 1658 02/05/19 1821    .  He was given sequential compression devices for DVT prophylaxis.  He benefited maximally from the hospital stay and there were no complications.    Recent vital signs:  Vitals:   02/05/19 2000 02/05/19 2030  BP: (!) 144/90 (!) 146/90  Pulse: 72 75  Resp: 15 16  Temp: 98.5 F (36.9 C) 98.3 F (36.8 C)  SpO2: 98% 97%    Recent laboratory studies:  Lab Results  Component Value Date   HGB 14.8 11/07/2014   HGB 15.6 11/03/2013   HGB 15.9 11/09/2012   Lab Results  Component Value Date   WBC 5.0 11/07/2014   PLT 133 (L) 11/07/2014   No results found for: INR Lab Results  Component Value Date   NA 141 11/07/2014   K 4.2 11/07/2014   CL  106 11/07/2014   CO2 25 11/07/2014   BUN 14 11/07/2014   CREATININE 0.84 11/07/2014   GLUCOSE 100 (H) 11/07/2014    Discharge Medications:   Allergies as of 02/05/2019      Reactions   Codeine Other (See Comments)   "bad headache"   Doxycycline Monohydrate Other (See Comments)   "extreme stomach pain"   Tetracyclines & Related       Medication List    STOP taking these medications   amoxicillin-clavulanate 875-125 MG tablet Commonly known as:  AUGMENTIN   fluticasone 50 MCG/ACT nasal spray Commonly known as:  FLONASE   lidocaine 5 % Commonly known as:  LIDODERM   PredniSONE 10 MG Kit   predniSONE 20 MG tablet Commonly known as:  DELTASONE     TAKE these medications   Co Q 10 100 MG Caps Take 1 capsule by mouth 2 (two) times daily. What changed:  Another medication with the same name was removed. Continue taking this medication, and follow the directions you see here.   ondansetron 4 MG tablet Commonly known as:  ZOFRAN Take 1 tablet (4 mg total) by mouth every 8 (eight) hours as needed for nausea or vomiting.   oxyCODONE 5 MG immediate release tablet Commonly known as:  ROXICODONE Take 1 tablet (5 mg total) by mouth every 6 (six) hours as needed for severe pain.     ASK your  doctor about these medications   aspirin 325 MG tablet Take 325 mg by mouth. Ask about: Which instructions should I use?   EPIPEN 2-PAK 0.3 mg/0.3 mL Soaj injection Generic drug:  EPINEPHrine USE AS DIRECTED.   fish oil-omega-3 fatty acids 1000 MG capsule Take 2 g by mouth 3 (three) times daily. Ask about: Which instructions should I use?   FLULAVAL QUADRIVALENT injection Generic drug:  influenza vac split quadrivalent ADM 0.5ML IM UTD   multivitamin tablet Take 1 tablet by mouth daily. Ask about: Which instructions should I use?   niacin 500 MG tablet Take 250 mg by mouth 3 (three) times daily. Ask about: Which instructions should I use?       Diagnostic Studies: Dg  C-arm 1-60 Min-no Report  Result Date: 02/05/2019 Fluoroscopy was utilized by the requesting physician.  No radiographic interpretation.    Disposition:     Follow-up Information    Irine Seal, MD.   Specialty:  Urology Why:  If not scheduled, please contact the office to see one of our nurse practitioners or PA's in 1-2 weeks.    Contact information: [address] [place] [address] [phone]            Signed: Irine Seal 02/11/2019, 9:03 AM

## 2019-02-15 ENCOUNTER — Encounter (HOSPITAL_COMMUNITY): Payer: Self-pay | Admitting: Urology

## 2022-05-21 ENCOUNTER — Other Ambulatory Visit: Payer: Self-pay | Admitting: Internal Medicine

## 2022-05-21 DIAGNOSIS — E785 Hyperlipidemia, unspecified: Secondary | ICD-10-CM

## 2022-06-28 ENCOUNTER — Ambulatory Visit
Admission: RE | Admit: 2022-06-28 | Discharge: 2022-06-28 | Disposition: A | Discharge status: Home / Self Care | Payer: No Typology Code available for payment source | Source: Ambulatory Visit | Attending: Internal Medicine | Admitting: Internal Medicine

## 2022-06-28 DIAGNOSIS — E785 Hyperlipidemia, unspecified: Secondary | ICD-10-CM

## 2022-06-30 ENCOUNTER — Other Ambulatory Visit: Payer: 59

## 2024-08-06 ENCOUNTER — Other Ambulatory Visit: Payer: Self-pay | Admitting: Internal Medicine

## 2024-08-06 DIAGNOSIS — R911 Solitary pulmonary nodule: Secondary | ICD-10-CM

## 2024-08-09 ENCOUNTER — Other Ambulatory Visit

## 2024-08-11 NOTE — Progress Notes (Unsigned)
 [facility]  PATIENT: Chad Mcfarland DOB: [birth year]  REFERRING DOCTOR OR PCP: Suzen Sprinkle PA-C SOURCE: Patient, notes from PCP, imaging and lab reports, imaging studies that were available were personally reviewed  _________________________________   HISTORICAL  CHIEF COMPLAINT:  Chief Complaint  Patient presents with   RM10/Paresthesia    Pt is here Alone. Pt states that he has numbness in his left forearm and in his left foot. Pt states that he has a sharp pain in the back of his right leg. Pt states he has neck issues that he thinks may be causing his symptoms.     HISTORY OF PRESENT ILLNESS:  I had the pleasure of seeing your patient, Chad Mcfarland, at [facility] for neurologic consultation regarding his left forearm and foot numbness.  He is a 68 year old man with left arm pain.  He notes over the summer on a very hot day, he had a headache.  While working at a desk in his home, he started noting numbness in the 4th and 5th digits of the left arm and some throbbing in th forearm/elbow.     He notes symptoms will improve if he hangs his arm down.  He also notes pain in his neck (has had chronically and intermittently).   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  He feels pain is different than 'funny bone' pain when he hits the ulnar nerve.   He notes no change in bladder function.    Symptoms are worse when seated (computer/TV) and do not occur while standing.    He has HTN.   He had a CT chest earlier today (he has a lung nodule)  He used to smoke, none > 30 years.       Imaging: MRI of the brain 02/22/2011 was normal.  The lumbar spine 06/15/2014 showed mild spinal stenosis at L2-L3 due to disc bulging and congenitally short pedicles and mild spinal stenosis at L3-L4 due to congenitally short pedicles minimal disc bulging and mild facet hypertrophy.  At L4-L5, there was disc bulging, facet hypertrophy and minimal spinal  stenosis and mild bilateral foraminal narrowing and lateral recess stenosis but no nerve root compression.  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  The metatarsal tarsal phalangeal joints showed small joint effusions and there was moderate degenerative change at the first metatarsophalangeal joint a small cystic structure at the fifth metatarsal head was noted a large multiseptated cyst between the 4th and 5th metatarsal shafts likely represents a ganglion cyst.  There were no fractures.  Overall  REVIEW OF SYSTEMS: Constitutional: No fevers, chills, sweats, or change in appetite Eyes: No visual changes, double vision, eye pain Ear, nose and throat: No hearing loss, ear pain, nasal congestion, sore throat Cardiovascular: No chest pain, palpitations Respiratory:  No shortness of breath at rest or with exertion.   No wheezes GastrointestinaI: No nausea, vomiting, diarrhea, abdominal pain, fecal incontinence Genitourinary:  No dysuria, urinary retention or frequency.  No nocturia. Musculoskeletal:  No neck pain, back pain Integumentary: No rash, pruritus, skin lesions Neurological: as above Psychiatric: No depression at this time.  No anxiety Endocrine: No palpitations, diaphoresis, change in appetite, change in weigh or increased thirst Hematologic/Lymphatic:  No anemia, purpura, petechiae. Allergic/Immunologic: No itchy/runny eyes, nasal congestion, recent allergic reactions, rashes  ALLERGIES: Allergies  Allergen Reactions  Codeine Other (See Comments)    bad headache   Doxycycline Monohydrate Other (See Comments)    extreme stomach pain   Tetracyclines & Related     HOME MEDICATIONS:  Current Outpatient Medications:    aspirin 325 MG tablet, Take 325 mg by mouth., Disp: , Rfl:    fish oil-omega-3 fatty acids 1000 MG capsule, Take 2 g by mouth 3 (three) times daily. , Disp: , Rfl:     lisinopril (ZESTRIL) 10 MG tablet, Take 10 mg by mouth., Disp: , Rfl:    LUTEIN PO, multivitamin, Disp: , Rfl:    Multiple Vitamin (MULTIVITAMIN) tablet, Take 1 tablet by mouth daily., Disp: , Rfl:    niacin 500 MG tablet, Take 250 mg by mouth 3 (three) times daily. , Disp: , Rfl:    Coenzyme Q10 (CO Q 10) 100 MG CAPS, Take 1 capsule by mouth 2 (two) times daily.  (Patient not taking: Reported on 08/14/2024), Disp: , Rfl:    EPIPEN 2-PAK 0.3 MG/0.3ML SOAJ injection, USE AS DIRECTED. (Patient not taking: Reported on 08/14/2024), Disp: 2 Device, Rfl: 2   FLULAVAL QUADRIVALENT injection, ADM 0.5ML IM UTD (Patient not taking: Reported on 08/14/2024), Disp: , Rfl: 0  PAST MEDICAL HISTORY: Past Medical History:  Diagnosis Date   History of kidney stones    Kidney stone    Sleep apnea    does not use cpap    PAST SURGICAL HISTORY: Past Surgical History:  Procedure Laterality Date   CYST REMOVAL TRUNK     lower spine about 20 years ago   CYSTOSCOPY WITH RETROGRADE PYELOGRAM, URETEROSCOPY AND STENT PLACEMENT Bilateral 02/05/2019   Procedure: CYSTOSCOPY WITH BILATERAL RETROGRADE PYELOGRAM, RIGHT URETEROSCOPY WITH LASER AND STENT PLACEMENT/LEFT URETEROSCOPY WITH STONE BASKETING;  Surgeon: Watt Rush, MD;  Location: [facility];  Service: Urology;  Laterality: Bilateral;   HOLMIUM LASER APPLICATION Bilateral 02/05/2019   Procedure: HOLMIUM LASER APPLICATION;  Surgeon: Watt Rush, MD;  Location: [facility];  Service: Urology;  Laterality: Bilateral;   NASAL SINUS SURGERY     VASECTOMY      FAMILY HISTORY: Family History  Problem Relation Age of Onset   Heart disease Mother    Diabetes Mother    Diabetes Sister     SOCIAL HISTORY: Social History   Socioeconomic History   Marital status: Married    Spouse name: Not on file   Number of children: Not on file   Years of education: Not on file   Highest education level: Not on file  Occupational History   Not on file  Tobacco Use   Smoking  status: Former    Current packs/day: 0.00    Types: Cigarettes    Quit date: 02/25/1997    Years since quitting: 27.4   Smokeless tobacco: Never  Substance and Sexual Activity   Alcohol use: Yes    Alcohol/week: 28.0 standard drinks of alcohol    Types: 21 Cans of beer, 7 Glasses of wine per week   Drug use: No   Sexual activity: Not on file  Other Topics Concern   Not on file  Social History Narrative   Not on file   Social Drivers of Health   Financial Resource Strain: Not on file  Food Insecurity: Not on file  Transportation Needs: Not on file  Physical Activity: Not on file  Stress: Not on file  Social Connections: Not on file  Intimate Partner Violence: Not on file       PHYSICAL EXAM  Vitals:   08/14/24 1404  BP: 134/76  Pulse: 85  SpO2: 96%  Weight: 220 lb 8 oz (100 kg)  Height: 5' 7 (1.702 m)    Body mass index is 34.54 kg/m.   General: The patient is well-developed and well-nourished and in no acute distress  HEENT:  Head is [place]/AT.  Sclera are anicteric.   Neck: No carotid bruits are noted.  The neck is nontender.  He has a mildly reduced range of motion.  Cardiovascular: The heart has a regular rate and rhythm with a normal S1 and S2. There were no murmurs, gallops or rubs.    Skin: Extremities are without rash or  edema.  Musculoskeletal:  Back is nontender  Neurologic Exam  Mental status: The patient is alert and oriented x 3 at the time of the examination. The patient has apparent normal recent and remote memory, with an apparently normal attention span and concentration ability.   Speech is normal.  Cranial nerves: Extraocular movements are full. Pupils are equal, round, and reactive to light and accomodation.  Visual fields are full.  Facial symmetry is present. There is good facial sensation to soft touch bilaterally.Facial strength is normal.  Trapezius and sternocleidomastoid strength is normal. No dysarthria is noted.  The tongue is  midline, and the patient has symmetric elevation of the soft palate. No obvious hearing deficits are noted.  Motor:  Muscle bulk is normal.   Tone is normal. Strength is  5 / 5 in all 4 extremities.   Sensory: No Tinel signs over the median or ulnar nerves.  Sensory testing is intact to pinprick, soft touch and vibration sensation in all 4 extremities.  Coordination: Cerebellar testing reveals good finger-nose-finger and heel-to-shin bilaterally.  Gait and station: Station is normal.   Gait is normal. Tandem gait is mildly wide. Romberg is negative.   Reflexes: Deep tendon reflexes are symmetric and 2 in the arms, 3 at the knees into the ankles bilaterally.        DIAGNOSTIC DATA (LABS, IMAGING, TESTING) - I reviewed patient records, labs, notes, testing and imaging myself where available.  Lab Results  Component Value Date   WBC 5.0 11/07/2014   HGB 14.8 11/07/2014   HCT 42.6 11/07/2014   MCV 93.4 11/07/2014   PLT 133 (L) 11/07/2014      Component Value Date/Time   NA 141 11/07/2014 0903   K 4.2 11/07/2014 0903   CL 106 11/07/2014 0903   CO2 25 11/07/2014 0903   GLUCOSE 100 (H) 11/07/2014 0903   BUN 14 11/07/2014 0903   CREATININE 0.84 11/07/2014 0903   CALCIUM 8.8 11/07/2014 0903   PROT 6.3 11/07/2014 0903   ALBUMIN 4.0 11/07/2014 0903   AST 19 11/07/2014 0903   ALT 18 11/07/2014 0903   ALKPHOS 56 11/07/2014 0903   BILITOT 0.6 11/07/2014 0903   GFRNONAA >89 11/07/2014 0903   GFRAA >89 11/07/2014 0903   Lab Results  Component Value Date   CHOL 141 11/07/2014   HDL 55 11/07/2014   LDLCALC 72 11/07/2014   TRIG 69 11/07/2014   CHOLHDL 2.6 11/07/2014   Lab Results  Component Value Date   HGBA1C 5.4 11/09/2012   No results found for: [id number] Lab Results  Component Value Date   TSH 3.029 11/09/2012       ASSESSMENT AND PLAN  Cervical radiculopathy - Plan: MR CERVICAL SPINE WO CONTRAST, NCV with EMG(electromyography)  Neuritis of left ulnar nerve -  Plan: MR CERVICAL SPINE  WO CONTRAST, NCV with EMG(electromyography)  Arm pain, left - Plan: MR CERVICAL SPINE WO CONTRAST, NCV with EMG(electromyography)   In summary, Mr. Troublefield is a 68 year old man with left arm pain and numbness.  Although the distribution of the numbness could be consistent with an ulnar neuropathy, he did not have a Tinel's sign over the left elbow.  He does not have any weakness in the ulnar distribution.  Therefore, a cervical radiculopathy might be more likely.  To better differentiate between these 2 options we will check MRI of the cervical spine and EMG/NCV.  Based on the results additional treatment or referral may be necessary.  To help with the pain, I will send in a steroid Dosepak.  Because symptoms are intermittent no indication for gabapentin at this time.  He will return to see me as needed after the studies.     Leoma Folds A. Vear, MD, Teola RENO 08/14/2024, 2:13 PM Certified in Neurology, Clinical Neurophysiology, Sleep Medicine and Neuroimaging  [facility] [address], [address] [place], [place] [address] [phone]

## 2024-08-14 ENCOUNTER — Encounter: Payer: Self-pay | Admitting: Neurology

## 2024-08-14 ENCOUNTER — Inpatient Hospital Stay
Admission: RE | Admit: 2024-08-14 | Discharge: 2024-08-14 | Disposition: A | Discharge status: Home / Self Care | Source: Ambulatory Visit | Attending: Internal Medicine | Admitting: Internal Medicine

## 2024-08-14 ENCOUNTER — Ambulatory Visit (INDEPENDENT_AMBULATORY_CARE_PROVIDER_SITE_OTHER): Admitting: Neurology

## 2024-08-14 VITALS — BP 134/76 | HR 85 | Ht 67.0 in | Wt 220.5 lb

## 2024-08-14 DIAGNOSIS — M79602 Pain in left arm: Secondary | ICD-10-CM

## 2024-08-14 DIAGNOSIS — M5412 Radiculopathy, cervical region: Secondary | ICD-10-CM | POA: Diagnosis not present

## 2024-08-14 DIAGNOSIS — G5622 Lesion of ulnar nerve, left upper limb: Secondary | ICD-10-CM

## 2024-08-14 DIAGNOSIS — R911 Solitary pulmonary nodule: Secondary | ICD-10-CM

## 2024-08-14 MED ORDER — METHYLPREDNISOLONE 4 MG PO TABS: ORAL_TABLET | ORAL | 0 refills | Status: AC

## 2024-08-14 MED ORDER — IOPAMIDOL (ISOVUE-370) INJECTION 76%
70.0000 mL | Freq: Once | INTRAVENOUS | Status: AC | PRN
  Administered 2024-08-14: 70 mL via INTRAVENOUS

## 2024-08-22 ENCOUNTER — Ambulatory Visit

## 2024-08-22 DIAGNOSIS — M79602 Pain in left arm: Secondary | ICD-10-CM

## 2024-08-22 DIAGNOSIS — M5412 Radiculopathy, cervical region: Secondary | ICD-10-CM | POA: Diagnosis not present

## 2024-08-22 DIAGNOSIS — G5622 Lesion of ulnar nerve, left upper limb: Secondary | ICD-10-CM

## 2024-08-23 ENCOUNTER — Ambulatory Visit: Payer: Self-pay | Admitting: Neurology

## 2024-09-19 ENCOUNTER — Telehealth: Payer: Self-pay | Admitting: Neurology

## 2024-09-19 NOTE — Telephone Encounter (Signed)
 Pt called  to cancel appt   due to wanting to be seen By Dr. Ines  Appt Cancel

## 2024-10-04 ENCOUNTER — Encounter: Admitting: Neurology

## 2024-12-03 ENCOUNTER — Ambulatory Visit: Admitting: Neurology
